# Patient Record
Sex: Female | Born: 1955
Health system: Southern US, Community
[De-identification: ages and names within clinical notes are randomized; demographics above are authoritative.]

## PROBLEM LIST (undated history)

## (undated) DIAGNOSIS — I249 Acute ischemic heart disease, unspecified: Secondary | ICD-10-CM

## (undated) DIAGNOSIS — G9389 Other specified disorders of brain: Secondary | ICD-10-CM

## (undated) DIAGNOSIS — E118 Type 2 diabetes mellitus with unspecified complications: Secondary | ICD-10-CM

## (undated) DIAGNOSIS — Z794 Long term (current) use of insulin: Secondary | ICD-10-CM

## (undated) DIAGNOSIS — I5181 Takotsubo syndrome: Secondary | ICD-10-CM

## (undated) DIAGNOSIS — R569 Unspecified convulsions: Secondary | ICD-10-CM

## (undated) DIAGNOSIS — I10 Essential (primary) hypertension: Secondary | ICD-10-CM

## (undated) DIAGNOSIS — I272 Pulmonary hypertension, unspecified: Secondary | ICD-10-CM

## (undated) DIAGNOSIS — I214 Non-ST elevation (NSTEMI) myocardial infarction: Secondary | ICD-10-CM

## (undated) HISTORY — DX: Essential (primary) hypertension: I10

## (undated) HISTORY — DX: Non-ST elevation (NSTEMI) myocardial infarction: I21.4

## (undated) HISTORY — DX: Type 2 diabetes mellitus with unspecified complications: E11.8

## (undated) HISTORY — DX: Pulmonary hypertension, unspecified: I27.20

## (undated) HISTORY — DX: Takotsubo syndrome: I51.81

## (undated) HISTORY — DX: Other specified disorders of brain: G93.89

## (undated) HISTORY — DX: Unspecified convulsions: R56.9

## (undated) HISTORY — DX: Acute ischemic heart disease, unspecified: I24.9

## (undated) HISTORY — DX: Long term (current) use of insulin: Z79.4

---

## 1995-06-03 HISTORY — PX: CHOLECYSTECTOMY: SHX55

## 2000-09-14 ENCOUNTER — Other Ambulatory Visit: Admission: RE | Admit: 2000-09-14 | Discharge: 2000-09-14 | Payer: Self-pay | Admitting: Obstetrics and Gynecology

## 2017-10-16 ENCOUNTER — Encounter (HOSPITAL_COMMUNITY): Payer: Self-pay | Admitting: *Deleted

## 2017-10-16 ENCOUNTER — Inpatient Hospital Stay (HOSPITAL_COMMUNITY)
Admission: AD | Admit: 2017-10-16 | Discharge: 2017-10-21 | DRG: 280 | Disposition: A | Discharge status: Home / Self Care | Payer: Self-pay | Source: Other Acute Inpatient Hospital | Attending: Family Medicine | Admitting: Family Medicine

## 2017-10-16 DIAGNOSIS — R4182 Altered mental status, unspecified: Secondary | ICD-10-CM

## 2017-10-16 DIAGNOSIS — I5021 Acute systolic (congestive) heart failure: Secondary | ICD-10-CM | POA: Diagnosis not present

## 2017-10-16 DIAGNOSIS — I420 Dilated cardiomyopathy: Secondary | ICD-10-CM | POA: Diagnosis present

## 2017-10-16 DIAGNOSIS — I214 Non-ST elevation (NSTEMI) myocardial infarction: Secondary | ICD-10-CM

## 2017-10-16 DIAGNOSIS — I249 Acute ischemic heart disease, unspecified: Secondary | ICD-10-CM

## 2017-10-16 DIAGNOSIS — Z9049 Acquired absence of other specified parts of digestive tract: Secondary | ICD-10-CM

## 2017-10-16 DIAGNOSIS — Z794 Long term (current) use of insulin: Secondary | ICD-10-CM

## 2017-10-16 DIAGNOSIS — I5181 Takotsubo syndrome: Secondary | ICD-10-CM | POA: Diagnosis present

## 2017-10-16 DIAGNOSIS — I493 Ventricular premature depolarization: Secondary | ICD-10-CM | POA: Diagnosis present

## 2017-10-16 DIAGNOSIS — G9389 Other specified disorders of brain: Secondary | ICD-10-CM

## 2017-10-16 DIAGNOSIS — E785 Hyperlipidemia, unspecified: Secondary | ICD-10-CM | POA: Diagnosis present

## 2017-10-16 DIAGNOSIS — E118 Type 2 diabetes mellitus with unspecified complications: Secondary | ICD-10-CM

## 2017-10-16 DIAGNOSIS — D320 Benign neoplasm of cerebral meninges: Secondary | ICD-10-CM | POA: Diagnosis present

## 2017-10-16 DIAGNOSIS — R569 Unspecified convulsions: Secondary | ICD-10-CM

## 2017-10-16 DIAGNOSIS — E119 Type 2 diabetes mellitus without complications: Secondary | ICD-10-CM | POA: Diagnosis present

## 2017-10-16 HISTORY — DX: Acute ischemic heart disease, unspecified: I24.9

## 2017-10-16 HISTORY — DX: Other specified disorders of brain: G93.89

## 2017-10-16 HISTORY — DX: Type 2 diabetes mellitus with unspecified complications: E11.8

## 2017-10-16 HISTORY — DX: Long term (current) use of insulin: Z79.4

## 2017-10-16 HISTORY — DX: Unspecified convulsions: R56.9

## 2017-10-16 MED ORDER — ASPIRIN 81 MG PO CHEW
324.0000 mg | CHEWABLE_TABLET | Freq: Once | ORAL | Status: AC
  Administered 2017-10-17: 324 mg via ORAL
  Filled 2017-10-16: qty 4

## 2017-10-16 NOTE — H&P (Signed)
History and Physical    [name] IDP:[id number] DOB: Feb 03, 1956 DOA: 10/16/2017  PCP: No primary care provider on file.  Patient coming from: [facility]   Chief Complaint: seizure-like activity   HPI: Diana [name] is a 62 y.o. female with no previously known medical history (hasn't seen a doctor in years) presenting transferred from [facility] hospital.  Was in her usual state of health today when during lunch developed shaking of right hand/arm and inability to control that hand/arm. Attempted to speak to husband but could not. This lasted approximately 10 minutes. Not too long afterward developed substernal chest pressure that resolved after about an hour. First time anything like this has happened. Did not lose consciousness, no loss of bowel or bladder. No history chest pain. Denies SOB or DOE. No known seizure history. Denies toxic habits. Doesn't take any medications. Unaware she has diabetes.  ED Course: CT head, keppra, neurosurg and cardiology consults, EKG  Review of Systems: As per HPI otherwise 10 point review of systems negative.    History reviewed. No pertinent past medical history.  Past Surgical History:  Procedure Laterality Date  . CHOLECYSTECTOMY  1997     reports that she has never smoked. She has never used smokeless tobacco. She reports that she does not drink alcohol or use drugs.  No Known Allergies  History reviewed. No pertinent family history.  Prior to Admission medications   Not on File    Physical Exam: Vitals:   10/16/17 2240  BP: 98/85  Pulse: (!) 123  Temp: 99.5 F (37.5 C)  TempSrc: Oral  SpO2: 98%  Weight: 70.5 kg (155 lb 6.8 oz)  Height: 5\' 3"  (1.6 m)    Constitutional: No acute distress Head: Atraumatic Eyes: Conjunctiva clear ENM: Moist mucous membranes. Normal dentition.  Neck: Supple Respiratory: Clear to auscultation bilaterally, no wheezing/rales/rhonchi. Normal respiratory effort. No accessory muscle use.  . Cardiovascular: tachycardic; regular rhythm. No murmurs/rubs/gallops. Abdomen: Non-tender, non-distended. No masses. No rebound or guarding. Positive bowel sounds. Musculoskeletal: No joint deformity upper and lower extremities. Normal ROM, no contractures. Normal muscle tone.  Skin: No rashes, lesions, or ulcers.  Extremities: No peripheral edema. Palpable peripheral pulses. Neurologic: Alert, moving all 4 extremities. Psychiatric: Normal insight and judgement.   Labs on Admission: I have personally reviewed following labs and imaging studies  CBC: No results for input(s): WBC, NEUTROABS, HGB, HCT, MCV, PLT in the last 168 hours. Basic Metabolic Panel: No results for input(s): NA, K, CL, CO2, GLUCOSE, BUN, CREATININE, CALCIUM, MG, PHOS in the last 168 hours. GFR: CrCl cannot be calculated (No order found.). Liver Function Tests: No results for input(s): AST, ALT, ALKPHOS, BILITOT, PROT, ALBUMIN in the last 168 hours. No results for input(s): LIPASE, AMYLASE in the last 168 hours. No results for input(s): AMMONIA in the last 168 hours. Coagulation Profile: No results for input(s): INR, PROTIME in the last 168 hours. Cardiac Enzymes: No results for input(s): CKTOTAL, CKMB, CKMBINDEX, TROPONINI in the last 168 hours. BNP (last 3 results) No results for input(s): PROBNP in the last 8760 hours. HbA1C: No results for input(s): HGBA1C in the last 72 hours. CBG: No results for input(s): GLUCAP in the last 168 hours. Lipid Profile: No results for input(s): CHOL, HDL, LDLCALC, TRIG, CHOLHDL, LDLDIRECT in the last 72 hours. Thyroid Function Tests: No results for input(s): TSH, T4TOTAL, FREET4, T3FREE, THYROIDAB in the last 72 hours. Anemia Panel: No results for input(s): VITAMINB12, FOLATE, FERRITIN, TIBC, IRON, RETICCTPCT in the last 72 hours.  Urine analysis: No results found for: COLORURINE, APPEARANCEUR, LABSPEC, PHURINE, GLUCOSEU, HGBUR, BILIRUBINUR, KETONESUR, PROTEINUR,  UROBILINOGEN, NITRITE, LEUKOCYTESUR  Radiological Exams on Admission: No results found.  EKG: pending  Assessment/Plan Active Problems:   Type 2 diabetes mellitus (HCC)   ACS (acute coronary syndrome) (HCC)   Brain mass   Seizure-like activity (HCC)   # Acute coronary syndrome - Chest pain and possible atypical acs symptoms earlier today, in setting of uncontrolled diabetes. Per report, dynamic EKG changes at [facility], troponin normal initially then elevated to 3.98. Initial concern for NSTEMI. Discussed W. Dr. Raiford Simmonds local cardologist discussed w/ Dr. Gillian Shields at [facility], plan for consult when pt arrives. Per report, cardiology advised no asa or anticoagulation for now (see below). I have spoken w/ our on-call cardiologist Dr. Emilio Aspen who advises asa 325 oral now, f/u ekg and initial troponin, likely start heparin acs nomogram pending neurosurg eval. I have spoken w/ neurosurg consult as well, who advises stat mri mrain; if consistent w/ meningioma and no signs bleeding, OK to start anticoagulation. Reassuringly, pt is hemodynamically stable and chest pain free currently. - aspirin - f/u troponin and trend - f/u EKG - f/u MRI, likely start acs nomogram heparin after - telemetry - npo - NS @ 125  # Brain mass  # Seizure-like activity- CT suggestive of meningioma. Today possible seizure. Keppra loaded at [facility]. No seizure-like activity since this afternoon. - continue keppra 500 mg po bid - neurology consult in AM - f/u MRI  # T2DM - new diagnosis, per report a1c at [place] [facility]. - SSI, likely start long-acting - risk stratification labs  DVT prophylaxis: SCDS, holding pharm pending MRI Code Status: full  Family Communication: husband Diana [name] [phone]  Disposition Plan: tbd  Consults called: cardiology, neurosurgery  Admission status: tele    Desma Maxim MD [facility] Pager [phone]  If 7PM-7AM, please contact  night-coverage [email or web address] Password [facility]  10/16/2017, 11:49 PM

## 2017-10-16 NOTE — Care Management (Signed)
This is a no charge note  Pending admission per Dr.   Nicki Guadalajara from [place] per Dr.  Levell July  62 year old female without significant past medical history, who was admitted to [facility] due to possible seizure and newly diagnosed DM. Her CT head was significant for 1.6 cm sagittal mass, but no brain edema or mass-effect.  Per ED discussion with Neurosurgery at [facility], this is most likely meningioma. Pt has abnormal EKG and elevated troponin 3.98. She was seen by cardiologist who discussed with STEMI Code Team at [facility]. They think this is possibly due to vasospasm. Dr. Raiford Simmonds local cardiologist discussed with Dr Gillian Shields at [facility], they are agreeable on consultation when patient arrives at [facility] regarding further recommendation.  They did not start IV heparin due to possible seizure and brain mass. Will need to discuss with [facility] neurosurgeon regarding starting IV heparin.  Currently patient is hemodynamically stable.   Dr. Levell July will cope his note and send [place] by Email. Will need MRI of brain with and without contrast; will need to consult neurosurgeon; will need to inform cardiology at pt's arrival.   Please call manager of [facility] at [phone] when pt arrives to floor   Ivor Costa, MD  [facility] Pager [phone]  If 7PM-7AM, please contact night-coverage [email or web address] Password [facility] 10/16/2017, 8:47 PM

## 2017-10-17 ENCOUNTER — Inpatient Hospital Stay (HOSPITAL_COMMUNITY): Payer: Self-pay

## 2017-10-17 ENCOUNTER — Other Ambulatory Visit: Payer: Self-pay

## 2017-10-17 DIAGNOSIS — I361 Nonrheumatic tricuspid (valve) insufficiency: Secondary | ICD-10-CM

## 2017-10-17 LAB — CBC WITH DIFFERENTIAL/PLATELET
Abs Immature Granulocytes: 0.1 10*3/uL (ref 0.0–0.1)
BASOS ABS: 0.1 10*3/uL (ref 0.0–0.1)
BASOS PCT: 1 %
EOS ABS: 0 10*3/uL (ref 0.0–0.7)
Eosinophils Relative: 0 %
HCT: 42.5 % (ref 36.0–46.0)
Hemoglobin: 14.8 g/dL (ref 12.0–15.0)
Immature Granulocytes: 0 %
Lymphocytes Relative: 16 %
Lymphs Abs: 2.4 10*3/uL (ref 0.7–4.0)
MCH: 29.7 pg (ref 26.0–34.0)
MCHC: 34.8 g/dL (ref 30.0–36.0)
MCV: 85.2 fL (ref 78.0–100.0)
Monocytes Absolute: 1.1 10*3/uL — ABNORMAL HIGH (ref 0.1–1.0)
Monocytes Relative: 7 %
Neutro Abs: 11.6 10*3/uL — ABNORMAL HIGH (ref 1.7–7.7)
Neutrophils Relative %: 76 %
PLATELETS: 352 10*3/uL (ref 150–400)
RBC: 4.99 MIL/uL (ref 3.87–5.11)
RDW: 12.2 % (ref 11.5–15.5)
WBC: 15.6 10*3/uL — AB (ref 4.0–10.5)

## 2017-10-17 LAB — LIPID PANEL
Cholesterol: 191 mg/dL (ref 0–200)
HDL: 32 mg/dL — AB (ref >40)
LDL CALC: 120 mg/dL — AB (ref 0–99)
TRIGLYCERIDES: 196 mg/dL — AB (ref <150)
Total CHOL/HDL Ratio: 6 RATIO
VLDL: 39 mg/dL (ref 0–40)

## 2017-10-17 LAB — BASIC METABOLIC PANEL
Anion gap: 15 (ref 5–15)
BUN: 15 mg/dL (ref 6–20)
CALCIUM: 9 mg/dL (ref 8.9–10.3)
CO2: 19 mmol/L — ABNORMAL LOW (ref 22–32)
CREATININE: 0.86 mg/dL (ref 0.44–1.00)
Chloride: 104 mmol/L (ref 101–111)
GFR calc Af Amer: >60 mL/min (ref >60)
GFR calc non Af Amer: >60 mL/min (ref >60)
Glucose, Bld: 346 mg/dL — ABNORMAL HIGH (ref 65–99)
POTASSIUM: 3.7 mmol/L (ref 3.5–5.1)
SODIUM: 138 mmol/L (ref 135–145)

## 2017-10-17 LAB — ECHOCARDIOGRAM COMPLETE
Height: 63 in
WEIGHTICAEL: 2486.79 [oz_av]

## 2017-10-17 LAB — HEPARIN LEVEL (UNFRACTIONATED)
Heparin Unfractionated: 0.26 IU/mL — ABNORMAL LOW (ref 0.30–0.70)
Heparin Unfractionated: 0.38 IU/mL (ref 0.30–0.70)

## 2017-10-17 LAB — COMPREHENSIVE METABOLIC PANEL
ALBUMIN: 3.7 g/dL (ref 3.5–5.0)
ALT: 16 U/L (ref 14–54)
AST: 30 U/L (ref 15–41)
Alkaline Phosphatase: 81 U/L (ref 38–126)
Anion gap: 11 (ref 5–15)
BILIRUBIN TOTAL: 0.9 mg/dL (ref 0.3–1.2)
BUN: 17 mg/dL (ref 6–20)
CALCIUM: 9.5 mg/dL (ref 8.9–10.3)
CHLORIDE: 101 mmol/L (ref 101–111)
CO2: 22 mmol/L (ref 22–32)
CREATININE: 1.41 mg/dL — AB (ref 0.44–1.00)
GFR calc Af Amer: 46 mL/min — ABNORMAL LOW (ref >60)
GFR calc non Af Amer: 39 mL/min — ABNORMAL LOW (ref >60)
GLUCOSE: 491 mg/dL — AB (ref 65–99)
Potassium: 4.4 mmol/L (ref 3.5–5.1)
SODIUM: 134 mmol/L — AB (ref 135–145)
Total Protein: 6.7 g/dL (ref 6.5–8.1)

## 2017-10-17 LAB — TYPE AND SCREEN
ABO/RH(D): O POS
Antibody Screen: NEGATIVE

## 2017-10-17 LAB — TSH: TSH: 5.1 u[IU]/mL — ABNORMAL HIGH (ref 0.350–4.500)

## 2017-10-17 LAB — APTT: aPTT: 72 seconds — ABNORMAL HIGH (ref 24–36)

## 2017-10-17 LAB — HEMOGLOBIN A1C
Hgb A1c MFr Bld: 11.2 % — ABNORMAL HIGH (ref 4.8–5.6)
Mean Plasma Glucose: 274.74 mg/dL

## 2017-10-17 LAB — TROPONIN I
TROPONIN I: 4.21 ng/mL — AB (ref <0.03)
Troponin I: 4.26 ng/mL (ref <0.03)
Troponin I: 4.52 ng/mL (ref <0.03)

## 2017-10-17 LAB — GLUCOSE, CAPILLARY
GLUCOSE-CAPILLARY: 293 mg/dL — AB (ref 65–99)
GLUCOSE-CAPILLARY: 295 mg/dL — AB (ref 65–99)
Glucose-Capillary: 453 mg/dL — ABNORMAL HIGH (ref 65–99)
Glucose-Capillary: 311 mg/dL — ABNORMAL HIGH (ref 65–99)
Glucose-Capillary: 276 mg/dL — ABNORMAL HIGH (ref 65–99)

## 2017-10-17 LAB — ABO/RH: ABO/RH(D): O POS

## 2017-10-17 LAB — PROTIME-INR
INR: 1.17
PROTHROMBIN TIME: 14.8 s (ref 11.4–15.2)

## 2017-10-17 MED ORDER — ATORVASTATIN CALCIUM 80 MG PO TABS
80.0000 mg | ORAL_TABLET | Freq: Every day | ORAL | Status: DC
  Administered 2017-10-17 – 2017-10-19 (×3): 80 mg via ORAL
  Filled 2017-10-17 (×3): qty 1

## 2017-10-17 MED ORDER — INSULIN ASPART 100 UNIT/ML ~~LOC~~ SOLN
0.0000 [IU] | Freq: Every day | SUBCUTANEOUS | Status: DC
  Administered 2017-10-17 – 2017-10-18 (×2): 3 [IU] via SUBCUTANEOUS
  Administered 2017-10-20: 4 [IU] via SUBCUTANEOUS

## 2017-10-17 MED ORDER — CARVEDILOL 12.5 MG PO TABS: 12.5000 mg | ORAL_TABLET | Freq: Two times a day (BID) | ORAL | Status: DC

## 2017-10-17 MED ORDER — INSULIN ASPART 100 UNIT/ML ~~LOC~~ SOLN
8.0000 [IU] | Freq: Once | SUBCUTANEOUS | Status: AC
  Administered 2017-10-17: 8 [IU] via SUBCUTANEOUS

## 2017-10-17 MED ORDER — GADOBENATE DIMEGLUMINE 529 MG/ML IV SOLN
7.0000 mL | Freq: Once | INTRAVENOUS | Status: AC | PRN
  Administered 2017-10-17: 7 mL via INTRAVENOUS

## 2017-10-17 MED ORDER — HEPARIN (PORCINE) IN NACL 100-0.45 UNIT/ML-% IJ SOLN
1200.0000 [IU]/h | INTRAMUSCULAR | Status: DC
  Administered 2017-10-17: 900 [IU]/h via INTRAVENOUS
  Administered 2017-10-18: 1050 [IU]/h via INTRAVENOUS
  Filled 2017-10-17 (×3): qty 250

## 2017-10-17 MED ORDER — METOPROLOL TARTRATE 12.5 MG HALF TABLET
12.5000 mg | ORAL_TABLET | Freq: Two times a day (BID) | ORAL | Status: DC
  Administered 2017-10-17 – 2017-10-20 (×6): 12.5 mg via ORAL
  Filled 2017-10-17 (×7): qty 1

## 2017-10-17 MED ORDER — HEPARIN BOLUS VIA INFUSION
2000.0000 [IU] | Freq: Once | INTRAVENOUS | Status: AC
  Administered 2017-10-17: 2000 [IU] via INTRAVENOUS
  Filled 2017-10-17: qty 2000

## 2017-10-17 MED ORDER — LEVETIRACETAM IN NACL 500 MG/100ML IV SOLN
500.0000 mg | Freq: Two times a day (BID) | INTRAVENOUS | Status: DC
  Administered 2017-10-17 – 2017-10-20 (×7): 500 mg via INTRAVENOUS
  Filled 2017-10-17 (×7): qty 100

## 2017-10-17 MED ORDER — ASPIRIN EC 81 MG PO TBEC
81.0000 mg | DELAYED_RELEASE_TABLET | Freq: Every day | ORAL | Status: DC
  Administered 2017-10-17 – 2017-10-21 (×4): 81 mg via ORAL
  Filled 2017-10-17 (×4): qty 1

## 2017-10-17 MED ORDER — PERFLUTREN LIPID MICROSPHERE
INTRAVENOUS | Status: AC
  Administered 2017-10-17: 3 mL via INTRAVENOUS
  Filled 2017-10-17: qty 10

## 2017-10-17 MED ORDER — INSULIN ASPART 100 UNIT/ML ~~LOC~~ SOLN
0.0000 [IU] | Freq: Three times a day (TID) | SUBCUTANEOUS | Status: DC
  Administered 2017-10-17: 11 [IU] via SUBCUTANEOUS
  Administered 2017-10-17 (×2): 8 [IU] via SUBCUTANEOUS
  Administered 2017-10-18: 5 [IU] via SUBCUTANEOUS
  Administered 2017-10-18 (×2): 8 [IU] via SUBCUTANEOUS
  Administered 2017-10-19: 5 [IU] via SUBCUTANEOUS
  Administered 2017-10-19: 8 [IU] via SUBCUTANEOUS
  Administered 2017-10-20 (×3): 5 [IU] via SUBCUTANEOUS
  Administered 2017-10-21 (×2): 3 [IU] via SUBCUTANEOUS
  Administered 2017-10-21: 5 [IU] via SUBCUTANEOUS

## 2017-10-17 MED ORDER — PERFLUTREN LIPID MICROSPHERE
1.0000 mL | INTRAVENOUS | Status: AC | PRN
  Administered 2017-10-17: 3 mL via INTRAVENOUS
  Filled 2017-10-17: qty 10

## 2017-10-17 MED ORDER — SODIUM CHLORIDE 0.9 % IV BOLUS
1000.0000 mL | Freq: Once | INTRAVENOUS | Status: AC
  Administered 2017-10-17: 1000 mL via INTRAVENOUS

## 2017-10-17 MED ORDER — SODIUM CHLORIDE 0.9 % IV SOLN
INTRAVENOUS | Status: DC
  Administered 2017-10-17 (×2): via INTRAVENOUS

## 2017-10-17 NOTE — Progress Notes (Signed)
  Echocardiogram 2D Echocardiogram with definity has been performed.  Diana [name] M 10/17/2017, 2:53 PM

## 2017-10-17 NOTE — Progress Notes (Signed)
Patient ID: Diana [name], female   DOB: 30-Nov-1955, 62 y.o.   MRN: [id number]                                                                PROGRESS NOTE                                                                                                                                                                                                             Patient Demographics:    Diana [name], is a 62 y.o. female, DOB - Jun 30, 1955, EQA:[id number]  Admit date - 10/16/2017   Admitting Physician Ivor Costa, MD  Outpatient Primary MD for the patient is No primary care provider on file.  LOS - 1  Outpatient Specialists:  No chief complaint on file.      Brief Narrative a 62 y.o. female with no previously known medical history (hasn't seen a doctor in years) presenting transferred from [facility].  Was in her usual state of health today when during lunch developed shaking of right hand/arm and inability to control that hand/arm. Attempted to speak to husband but could not. This lasted approximately 10 minutes. Not too long afterward developed substernal chest pressure that resolved after about an hour. First time anything like this has happened. Did not lose consciousness, no loss of bowel or bladder. No history chest pain. Denies SOB or DOE. No known seizure history. Denies toxic habits. Doesn't take any medications. Unaware she has diabetes.  ED Course: CT head, keppra, neurosurg and cardiology consults, EKG      Subjective:    Tykesha Blazier today has no further chest pain.  Chest pain yesterday lasted for about 1 hour.  Left sided without radiation. Denies fever, chills, cough, sob, palp, n/v, diarrhea, brbpr.  Pt had seizure type activity with uncontrolled shaking of the right arm but has not had any further episode overnite.    No headache, No abdominal pain - No Nausea, No new weakness tingling or numbness.   Assessment  & Plan :    Active Problems:   Type 2 diabetes mellitus (HCC)  ACS (acute coronary syndrome) (HCC)   Brain mass   Seizure-like activity (HCC)    Acute coronary syndrome -  Chest pain and possible atypical acs symptoms earlier today, in setting of  uncontrolled diabetes. Per report, dynamic EKG changes at [place] hospital, troponin normal initially then elevated to 3.98. Initial concern for NSTEMI.  Cardiology consulted Aspirin received at [facility] ? Cont heparin iv Start Lipitor 80mg  po qhs Start metoprolol 12.5mg  po bid  (hold sbp <100, hr <60) Check lipid   Brain mass, Seizure-like activity-  MRI brain 5/18=> ? Malignancy Keppra loaded at [facility]. No further seizure like activity Continue keppra 500 mg iv bid Neurosurgery consult   T2DM - new diagnosis, per report a1c at [place] [facility]. Fsbs q4h, ISS   DVT prophylaxis: heparin, SCDS,  Code Status: full  Family Communication: w patient this am.  husband ken Hirata [phone]  Disposition Plan: tbd  Consults called: cardiology, neurosurgery  Admission status: tele , inpatient       Lab Results  Component Value Date   PLT 352 10/16/2017    Antibiotics  :  none  Anti-infectives (From admission, onward)   None        Objective:   Vitals:   10/16/17 2240  BP: 98/85  Pulse: (!) 123  Resp: 18  Temp: 99.5 F (37.5 C)  TempSrc: Oral  SpO2: 98%  Weight: 70.5 kg (155 lb 6.8 oz)  Height: 5\' 3"  (1.6 m)    Wt Readings from Last 3 Encounters:  10/16/17 70.5 kg (155 lb 6.8 oz)     Intake/Output Summary (Last 24 hours) at 10/17/2017 0736 Last data filed at 10/17/2017 0701 Gross per 24 hour  Intake 1311.02 ml  Output -  Net 1311.02 ml     Physical Exam  Awake Alert, Oriented X 3, No new F.N deficits, Normal affect [place].AT,PERRAL Supple Neck,No JVD, No cervical lymphadenopathy appriciated.  Symmetrical Chest wall movement, Good air movement bilaterally, CTAB RRR,No Gallops,Rubs or new Murmurs, No Parasternal Heave +ve B.Sounds, Abd Soft, No tenderness, No  organomegaly appriciated, No rebound - guarding or rigidity. No Cyanosis, Clubbing or edema, No new Rash or bruise     Data Review:    CBC Recent Labs  Lab 10/16/17 2336  WBC 15.6*  HGB 14.8  HCT 42.5  PLT 352  MCV 85.2  MCH 29.7  MCHC 34.8  RDW 12.2  LYMPHSABS 2.4  MONOABS 1.1*  EOSABS 0.0  BASOSABS 0.1    Chemistries  Recent Labs  Lab 10/16/17 2336  NA 134*  K 4.4  CL 101  CO2 22  GLUCOSE 491*  BUN 17  CREATININE 1.41*  CALCIUM 9.5  AST 30  ALT 16  ALKPHOS 81  BILITOT 0.9   ------------------------------------------------------------------------------------------------------------------ No results for input(s): CHOL, HDL, LDLCALC, TRIG, CHOLHDL, LDLDIRECT in the last 72 hours.  Lab Results  Component Value Date   HGBA1C 11.2 (H) 10/16/2017   ------------------------------------------------------------------------------------------------------------------ No results for input(s): TSH, T4TOTAL, T3FREE, THYROIDAB in the last 72 hours.  Invalid input(s): FREET3 ------------------------------------------------------------------------------------------------------------------ No results for input(s): VITAMINB12, FOLATE, FERRITIN, TIBC, IRON, RETICCTPCT in the last 72 hours.  Coagulation profile No results for input(s): INR, PROTIME in the last 168 hours.  No results for input(s): DDIMER in the last 72 hours.  Cardiac Enzymes Recent Labs  Lab 10/16/17 2336  TROPONINI 4.52*   ------------------------------------------------------------------------------------------------------------------ No results found for: BNP  Inpatient Medications  Scheduled Meds: . insulin aspart  0-15 Units Subcutaneous TID WC  . insulin aspart  0-5 Units Subcutaneous QHS   Continuous Infusions: . sodium chloride 125 mL/hr at 10/17/17 0444  . heparin 900 Units/hr (10/17/17 0617)  . levETIRAcetam     PRN Meds:.  Micro Results No results found for this or any  previous visit (from the past 240 hour(s)).  Radiology Reports Mr Jeri Cos Wo Contrast  Result Date: 10/17/2017 CLINICAL DATA:  Follow-up examination for intracranial mass. EXAM: MRI HEAD WITHOUT AND WITH CONTRAST TECHNIQUE: Multiplanar, multiecho pulse sequences of the brain and surrounding structures were obtained without and with intravenous contrast. CONTRAST:  45mL MULTIHANCE GADOBENATE DIMEGLUMINE 529 MG/ML IV SOLN COMPARISON:  Prior CT from 10/16/2017. FINDINGS: Brain: Abnormal T2/FLAIR signal abnormality seen involving the parasagittal left frontal lobe, cingulate gyrus (series 11001, image 18). Area of involvement measures approximately 4.6 x 1.3 cm in size. There is a superimposed area of abnormal postcontrast enhancement within this region, measuring 1.5 x 1.2 x 1.3 cm (series 17001, image 45). This area postcontrast enhancement corresponds with abnormality seen on prior CT. Signal changes are intra-axial in location. No associated susceptibility artifact or intrinsic T1 shortening to suggest hemorrhage. 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 Superimposed postictal changes may be contributory as well. While subacute ischemia could conceivably have this appearance, this is less favored given the enhancement pattern in appearance on prior CT. No other focal parenchymal signal abnormality identified. Cerebral volume is normal for age. No significant cerebral white matter changes. No other evidence for acute or subacute ischemia. Gray-white matter differentiation otherwise well maintained. No encephalomalacia to suggest chronic infarction. No foci of susceptibility artifact to suggest acute or chronic intracranial hemorrhage. No other mass lesion, midline shift, or mass effect. No hydrocephalus. No extra-axial fluid collection. Major dural sinuses are grossly patent. Pituitary gland and suprasellar region normal. Midline structures intact and normal. Vascular: Major intracranial vascular  flow voids are maintained. Skull and upper cervical spine: Craniocervical junction within normal limits. Visualized upper cervical spine normal. Bone marrow signal intensity within normal limits. No scalp soft tissue abnormality. Sinuses/Orbits: Globes orbital soft tissues within normal limits. Paranasal sinuses are largely clear. No mastoid effusion. Inner ear structures normal. Other: None. IMPRESSION: 1. Abnormal signal intensity with superimposed 1.5 x 1.2 x 1.3 cm area of abnormal enhancement involving the left cingulate parasagittal left frontal lobe as above. 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 Superimposed postictal changes may be contributory as well. While subacute ischemia could conceivably have this appearance, this is less favored given the enhancement pattern in appearance on prior CT. No associated hemorrhage or mass effect. 2. Otherwise normal brain MRI. Electronically Signed   By: Jeannine Boga M.D.   On: 10/17/2017 05:26    Time Spent in minutes  30   Jani Gravel M.D on 10/17/2017 at 7:36 AM  Between 7am to 7pm - Pager - [phone]    After 7pm go to [email or web address] - password [facility]  [facility] -  Office  [phone]

## 2017-10-17 NOTE — Consult Note (Addendum)
CARDIOLOGY CONSULT NOTE   Referring Physician: Dr. Si Raider Primary Physician: None Primary Cardiologist: None Reason for Consultation: Troponin elevation   HPI: Diana [name] is a 63 y.o. female w/ no medical history who presents with seizure like activity, found to have an intracranial mass. Cardiology is consulted for elevated troponin.   In brief, the patient does not routinely see doctors and does not have any known medical problems. She was out to eat with her husband when suddenly she could not properly use her R and and arm. She tried to tell her husband about this but was unable to speak. She had never had any symptoms like this before. She was brought to [facility] for workup. A CT head was performed and revealed a 1.6 cm intracranial mass, likely meningioma.   While in the ED she reported feeling very mild chest pressure. A troponin was checked and was initially zero, but increased to ~3 on a subsequent check. She was not given any treatment for ACS. Her chest symptoms had completely resolved. She was transferred to [facility] for further treatment.   Of note, the patient does not smoke cigarettes, does not drink alcohol, and does not use drugs. She does not perform any regular physical activity. She does become short of breath when walking up a flight of stairs. She has no symptoms of exertional chest pain or pressure.   Review of Systems:     Cardiac Review of Systems: {Y] = yes [ ]  = no  Chest Pain [  Y  ]  Resting SOB [   ] Exertional SOB  [  ]  Orthopnea [  ]   Pedal Edema [   ]    Palpitations [  ] Syncope  [  ]   Presyncope [   ]  General Review of Systems: [Y] = yes [  ]=no Constitional: recent weight change [  ]; anorexia [  ]; fatigue [  ]; nausea [  ]; night sweats [  ]; fever [  ]; or chills [  ];                                                                     Eyes : blurred vision [  ]; diplopia [   ]; vision changes [  ];  Amaurosis fugax[  ]; Resp: cough [  ];   wheezing[  ];  hemoptysis[  ];  PND [  ];  GI:  gallstones[  ], vomiting[  ];  dysphagia[  ]; melena[  ];  hematochezia [  ]; heartburn[  ];   GU: kidney stones [  ]; hematuria[  ];   dysuria [  ];  nocturia[  ]; incontinence [  ];             Skin: rash, swelling[  ];, hair loss[  ];  peripheral edema[  ];  or itching[  ]; Musculosketetal: myalgias[  ];  joint swelling[  ];  joint erythema[  ];  joint pain[  ];  back pain[  ];  Heme/Lymph: bruising[  ];  bleeding[  ];  anemia[  ];  Neuro: TIA[  ];  headaches[  ];  stroke[  ];  vertigo[  ];  seizures[  ];  paresthesias[  ];  difficulty walking[  ];  Psych:depression[  ]; anxiety[  ];  Endocrine: diabetes[  ];  thyroid dysfunction[  ];  Other:  History reviewed. No pertinent past medical history.  No medications prior to admission.    Infusions: . sodium chloride      No Known Allergies  Social History   Socioeconomic History  . Marital status: Unknown    Spouse name: Not on file  . Number of children: Not on file  . Years of education: Not on file  . Highest education level: Not on file  Occupational History  . Not on file  Social Needs  . Financial resource strain: Not on file  . Food insecurity:    Worry: Not on file    Inability: Not on file  . Transportation needs:    Medical: Not on file    Non-medical: Not on file  Tobacco Use  . Smoking status: Never Smoker  . Smokeless tobacco: Never Used  Substance and Sexual Activity  . Alcohol use: Never    Frequency: Never  . Drug use: Never  . Sexual activity: Yes  Lifestyle  . Physical activity:    Days per week: Not on file    Minutes per session: Not on file  . Stress: Not on file  Relationships  . Social connections:    Talks on phone: Not on file    Gets together: Not on file    Attends religious service: Not on file    Active member of club or organization: Not on file    Attends meetings of clubs or organizations: Not on file    Relationship status:  Not on file  . Intimate partner violence:    Fear of current or ex partner: Not on file    Emotionally abused: Not on file    Physically abused: Not on file    Forced sexual activity: Not on file  Other Topics Concern  . Not on file  Social History Narrative  . Not on file    History reviewed. No pertinent family history.  PHYSICAL EXAM: Vitals:   10/16/17 2240  BP: 98/85  Pulse: (!) 123  Resp: 18  Temp: 99.5 F (37.5 C)  SpO2: 98%    No intake or output data in the 24 hours ending 10/17/17 0057  General:  Well appearing. No respiratory difficulty HEENT: normal Neck: supple. no JVD. Carotids 2+ bilat; no bruits. No lymphadenopathy or thryomegaly appreciated. Cor: PMI nondisplaced. Tachycardic. +S4. No murmurs. Lungs: clear Abdomen: soft, nontender, nondistended. No hepatosplenomegaly. No bruits or masses. Good bowel sounds. Extremities: no cyanosis, clubbing, rash, edema Neuro: alert & oriented x 3, cranial nerves grossly intact. moves all 4 extremities w/o difficulty. Affect pleasant.  ECG:  Sinus tachycardia, HR 120, left axis deviation, occasional PVCs, inferior/septal/anterior Q waves, 2 cm ST elevation in V2 (likely J point elevation); no prior ECG for comparison  Results for orders placed or performed during the hospital encounter of 10/16/17 (from the past 24 hour(s))  CBC with Differential/Platelet     Status: Abnormal   Collection Time: 10/16/17 11:36 PM  Result Value Ref Range   WBC 15.6 (H) 4.0 - 10.5 K/uL   RBC 4.99 3.87 - 5.11 MIL/uL   Hemoglobin 14.8 12.0 - 15.0 g/dL   HCT 42.5 36.0 - 46.0 %   MCV 85.2 78.0 - 100.0 fL   MCH 29.7 26.0 - 34.0 pg   MCHC 34.8 30.0 - 36.0 g/dL  RDW 12.2 11.5 - 15.5 %   Platelets 352 150 - 400 K/uL   Neutrophils Relative % 76 %   Neutro Abs 11.6 (H) 1.7 - 7.7 K/uL   Lymphocytes Relative 16 %   Lymphs Abs 2.4 0.7 - 4.0 K/uL   Monocytes Relative 7 %   Monocytes Absolute 1.1 (H) 0.1 - 1.0 K/uL   Eosinophils Relative 0 %     Eosinophils Absolute 0.0 0.0 - 0.7 K/uL   Basophils Relative 1 %   Basophils Absolute 0.1 0.0 - 0.1 K/uL   Immature Granulocytes 0 %   Abs Immature Granulocytes 0.1 0.0 - 0.1 K/uL  Comprehensive metabolic panel     Status: Abnormal   Collection Time: 10/16/17 11:36 PM  Result Value Ref Range   Sodium 134 (L) 135 - 145 mmol/L   Potassium 4.4 3.5 - 5.1 mmol/L   Chloride 101 101 - 111 mmol/L   CO2 22 22 - 32 mmol/L   Glucose, Bld 491 (H) 65 - 99 mg/dL   BUN 17 6 - 20 mg/dL   Creatinine, Ser 1.41 (H) 0.44 - 1.00 mg/dL   Calcium 9.5 8.9 - 10.3 mg/dL   Total Protein 6.7 6.5 - 8.1 g/dL   Albumin 3.7 3.5 - 5.0 g/dL   AST 30 15 - 41 U/L   ALT 16 14 - 54 U/L   Alkaline Phosphatase 81 38 - 126 U/L   Total Bilirubin 0.9 0.3 - 1.2 mg/dL   GFR calc non Af Amer 39 (L) >60 mL/min   GFR calc Af Amer 46 (L) >60 mL/min   Anion gap 11 5 - 15  Hemoglobin A1c     Status: Abnormal   Collection Time: 10/16/17 11:36 PM  Result Value Ref Range   Hgb A1c MFr Bld 11.2 (H) 4.8 - 5.6 %   Mean Plasma Glucose 274.74 mg/dL  Type and screen [place]     Status: None   Collection Time: 10/16/17 11:36 PM  Result Value Ref Range   ABO/RH(D) O POS    Antibody Screen NEG    Sample Expiration      10/19/2017 Performed at [facility], [address] [place], [place] [address]   Troponin I (q 6hr x 3)     Status: Abnormal   Collection Time: 10/16/17 11:36 PM  Result Value Ref Range   Troponin I 4.52 (HH) <0.03 ng/mL  ABO/Rh     Status: None (Preliminary result)   Collection Time: 10/16/17 11:36 PM  Result Value Ref Range   ABO/RH(D)      O POS Performed at [facility] [address], [place], [place] [address]    No results found.   ASSESSMENT: In summary, this is a patient with no prior medical history who presents with abnormal R arm activity, found to have an intracranial mass, new diabetes, and a NSTEMI. It is difficult to connect her R arm and speech symptoms  with her NSTEMI. Nonetheless, she had chest pain, ECG abnormalities, and a positive troponin and thus should be treated as a type 1 acute coronary syndrome. She is currently being evaluated by neurosurgery as to whether she is able to receive heparin for anticoagulation and whether there is any indication for surgery. Any future cardiac intervention will be contingent on the neurosurgical decision-making.   It is worth noting that there are several characteristics of the patient's case that are of concern. Her resting tachycardia to the 120's, the  multiple territories of infarct on ECG, her frequent ventricular ectopy, and her rapidly rising troponin are examples of such. She warrants close observation over the next 24 to 48 hours while additional data are collected.   PLAN/DISCUSSION: - give aspirin 324mg  once then 81mg  daily - start atorvastatin 80mg  daily - order echocardiogram - repeat troponin x 2 - give 1L normal saline bolus due to tachycardia - start insulin therapy for treatment of diabetes - hold off on starting beta blocker for now until systolic function is evaluated by echo - start heparin drip with PTT goal 50-70 as soon as ok'd by neurosurgery  Cardiology will continue to follow  Marcie Mowers, MD Cardiology Fellow, PGY-5

## 2017-10-17 NOTE — Progress Notes (Signed)
Nutrition Brief Note  Patient identified on the Malnutrition Screening Tool (MST) Report. She reports stable weight for the past 1.5 years; prior to that she had lost a lot of weight.  Nutrition focused physical exam completed.  No muscle or subcutaneous fat depletion noticed.   Wt Readings from Last 15 Encounters:  10/16/17 155 lb 6.8 oz (70.5 kg)    Body mass index is 27.53 kg/m. Patient meets criteria for overweight based on current BMI.   Current diet order is NPO for possible procedure, patient is hungry. Labs and medications reviewed. Expect intake will be adequate when diet is advanced.  No nutrition interventions warranted at this time. If nutrition issues arise, please consult RD.   Molli Barrows, RD, LDN, [place] Pager [phone] After Hours Pager [phone]

## 2017-10-17 NOTE — Progress Notes (Signed)
ANTICOAGULATION CONSULT NOTE - Follow Up Consult  Pharmacy Consult for Heparin Indication: chest pain/ACS  No Known Allergies  Patient Measurements: Height: 5\' 3"  (160 cm) Weight: 155 lb 6.8 oz (70.5 kg) IBW/kg (Calculated) : 52.4 Heparin Dosing Weight: 67 kg  Vital Signs: Temp: 98.9 F (37.2 C) (05/18 2009) Temp Source: Oral (05/18 2009) BP: 94/70 (05/18 2009) Pulse Rate: 98 (05/18 2009)  Labs: Recent Labs    10/16/17 2336 10/17/17 0640 10/17/17 1210 10/17/17 1934  HGB 14.8  --   --   --   HCT 42.5  --   --   --   PLT 352  --   --   --   APTT  --  72*  --   --   LABPROT  --  14.8  --   --   INR  --  1.17  --   --   HEPARINUNFRC  --   --  0.26* 0.38  CREATININE 1.41* 0.86  --   --   TROPONINI 4.52* 4.21* 4.26*  --     Estimated Creatinine Clearance: 64.6 mL/min (by C-G formula based on SCr of 0.86 mg/dL).   Medications:  Infusions:  . sodium chloride 125 mL/hr at 10/17/17 1405  . heparin 1,050 Units/hr (10/17/17 1015)  . levETIRAcetam Stopped (10/17/17 1610)    Assessment: 62 year old female receiving anticoagulation with heparin for NSTEMI. Heparin level is now therapeutic at 0.38. No signs of bleeding noted.   Goal of Therapy:  Heparin level 0.3-0.7 units/ml Monitor platelets by anticoagulation protocol: Yes   Plan:  Continue heparin at 1050 units/hr Daily heparin level and CBC Monitor for signs/symptoms of bleeding   Jimmy Footman, PharmD, BCPS PGY2 Infectious Diseases Pharmacy Resident Pager: [phone]  10/17/2017, 8:35 PM

## 2017-10-17 NOTE — Progress Notes (Signed)
NP for neurosurgery team in to visit patient. After she left pt voice concerns and fears. She is adamant that she is not going to have surgical intervention. She shared that her 2 sisters died from caner and their last days were "Hell". She asked if it was really work it.  Allowed patient to vent. Encourage pt to speak with her husband to talk about next steps. She is receptive to going to [facility] to hear options  Spoke with MD in regards to a diet for the patient.  Orders given. Spoke with MD. Updated pt. Cont with plan of care

## 2017-10-17 NOTE — Progress Notes (Signed)
ANTICOAGULATION CONSULT NOTE - Follow Up Consult  Pharmacy Consult for Heparin Indication: chest pain/ACS  No Known Allergies  Patient Measurements: Height: 5\' 3"  (160 cm) Weight: 155 lb 6.8 oz (70.5 kg) IBW/kg (Calculated) : 52.4 Heparin Dosing Weight: 67 kg  Vital Signs: Temp: 98.2 F (36.8 C) (05/18 0800) Temp Source: Oral (05/18 0800) BP: 104/78 (05/18 0933) Pulse Rate: 110 (05/18 0933)  Labs: Recent Labs    10/16/17 2336 10/17/17 0640 10/17/17 1210  HGB 14.8  --   --   HCT 42.5  --   --   PLT 352  --   --   APTT  --  72*  --   LABPROT  --  14.8  --   INR  --  1.17  --   HEPARINUNFRC  --   --  0.26*  CREATININE 1.41* 0.86  --   TROPONINI 4.52* 4.21*  --     Estimated Creatinine Clearance: 64.6 mL/min (by C-G formula based on SCr of 0.86 mg/dL).   Medications:  Infusions:  . sodium chloride 125 mL/hr at 10/17/17 0444  . heparin 900 Units/hr (10/17/17 0617)  . levETIRAcetam Stopped (10/17/17 9470)    Assessment: 62 year old female receiving anticoagulation with heparin for NSTEMI. Her initial heparin level is subtherapeutic.  No bleeding noted.  Goal of Therapy:  Heparin level 0.3-0.7 units/ml Monitor platelets by anticoagulation protocol: Yes   Plan:  Increase heparin to 1050 units/hr Check heparin level in 6 hours Daily heparin level and CBC  Legrand Como, Pharm.D., BCPS, BCIDP Clinical Pharmacist Phone: [phone] or [phone] 10/17/2017, 1:11 PM

## 2017-10-17 NOTE — Progress Notes (Signed)
ANTICOAGULATION CONSULT NOTE - Initial Consult  Pharmacy Consult for heparin Indication: chest pain/ACS  No Known Allergies  Patient Measurements: Height: 5\' 3"  (160 cm) Weight: 155 lb 6.8 oz (70.5 kg) IBW/kg (Calculated) : 52.4 Heparin Dosing Weight: 67 kg  Vital Signs: Temp: 99.5 F (37.5 C) (05/17 2240) Temp Source: Oral (05/17 2240) BP: 98/85 (05/17 2240) Pulse Rate: 123 (05/17 2240)  Labs: Recent Labs    10/16/17 2336  HGB 14.8  HCT 42.5  PLT 352  CREATININE 1.41*  TROPONINI 4.52*    Estimated Creatinine Clearance: 39.4 mL/min (A) (by C-G formula based on SCr of 1.41 mg/dL (H)).   Medical History: History reviewed. No pertinent past medical history.  Medications:  No meds PTA  Assessment: 62 yo lady with intracranial mass and elevated troponin to start heparin.  Her MRI did not show evidence of hemorrhage. Goal of Therapy:  Heparin level 0.3-0.7 units/ml Monitor platelets by anticoagulation protocol: Yes   Plan:  Heparin bolus 2000 units and drip at 900 units/hr Check heparin level in 6 hours Daily heparin level and CBC while on heparin Monitor for bleeding complications  Tiah Heckel [place] 10/17/2017,5:40 AM

## 2017-10-17 NOTE — Consult Note (Signed)
Reason for Consult: brain tumor   Referring Physician: Dr. Kelli Churn Diana [name] is an 62 y.o. female.   HPI:  62 year old patient presented to [facility] after an episode of right hand shaking uncontrollably and difficulty speaking. She states that the episode only last for about 58minutes and then resolved. She denies any HA, NV or vision changes. She has never experienced any episodes like this before. Cards is working her up for a nonstemi as well because her troponins were elevated  History reviewed. No pertinent past medical history.  Past Surgical History:  Procedure Laterality Date  . CHOLECYSTECTOMY  1997    No Known Allergies  Social History   Tobacco Use  . Smoking status: Never Smoker  . Smokeless tobacco: Never Used  Substance Use Topics  . Alcohol use: Never    Frequency: Never    History reviewed. No pertinent family history.   Review of Systems  Positive ROS: negative  All other systems have been reviewed and were otherwise negative with the exception of those mentioned in the HPI and as above.  Objective: Vital signs in last 24 hours: Temp:  [98.2 F (36.8 C)-99.5 F (37.5 C)] 98.2 F (36.8 C) (05/18 0800) Pulse Rate:  [110-123] 110 (05/18 0933) Resp:  [18] 18 (05/18 0800) BP: (98-104)/(76-85) 104/78 (05/18 0933) SpO2:  [98 %] 98 % (05/18 0800) Weight:  [155 lb 6.8 oz (70.5 kg)] 155 lb 6.8 oz (70.5 kg) (05/17 2240)  General Appearance: Alert, cooperative, no distress, appears stated age Head: Normocephalic, without obvious abnormality, atraumatic Eyes: PERRL, conjunctiva/corneas clear, EOM's intact, fundi benign, both eyes      Lungs: respirations unlabored Heart: Regular rate and rhythm Extremities: Extremities normal, atraumatic, no cyanosis or edema Pulses: 2+ and symmetric all extremities Skin: Skin color, texture, turgor normal, no rashes or lesions  NEUROLOGIC:   Mental status: A&O x4, no aphasia, good attention span, Memory and fund of  knowledge Motor Exam - grossly normal, normal tone and bulk Sensory Exam - grossly normal Reflexes: symmetric, no pathologic reflexes, No Hoffman's, No clonus Coordination - grossly normal Gait - grossly normal Balance - grossly normal Cranial Nerves: I: smell Not tested  II: visual acuity  OS: na  OD: na  II: visual fields Full to confrontation  II: pupils Equal, round, reactive to light  III,VII: ptosis None  III,IV,VI: extraocular muscles  Full ROM  V: mastication Normal  V: facial light touch sensation  Normal  V,VII: corneal reflex  Present  VII: facial muscle function - upper  Normal  VII: facial muscle function - lower Normal  VIII: hearing Not tested  IX: soft palate elevation  Normal  IX,X: gag reflex Present  XI: trapezius strength  5/5  XI: sternocleidomastoid strength 5/5  XI: neck flexion strength  5/5  XII: tongue strength  Normal    Data Review Lab Results  Component Value Date   WBC 15.6 (H) 10/16/2017   HGB 14.8 10/16/2017   HCT 42.5 10/16/2017   MCV 85.2 10/16/2017   PLT 352 10/16/2017   Lab Results  Component Value Date   NA 138 10/17/2017   K 3.7 10/17/2017   CL 104 10/17/2017   CO2 19 (L) 10/17/2017   BUN 15 10/17/2017   CREATININE 0.86 10/17/2017   GLUCOSE 346 (H) 10/17/2017   Lab Results  Component Value Date   INR 1.17 10/17/2017    Radiology: Mr Jeri Cos CN Contrast  Result Date: 10/17/2017 CLINICAL DATA:  Follow-up examination for intracranial mass. EXAM: MRI HEAD WITHOUT AND WITH CONTRAST TECHNIQUE: Multiplanar, multiecho pulse sequences of the brain and surrounding structures were obtained without and with intravenous contrast. CONTRAST:  71mL MULTIHANCE GADOBENATE DIMEGLUMINE 529 MG/ML IV SOLN COMPARISON:  Prior CT from 10/16/2017. FINDINGS: Brain: Abnormal T2/FLAIR signal abnormality seen involving the parasagittal left frontal lobe, cingulate gyrus (series 11001, image 18). Area of involvement measures approximately 4.6 x 1.3 cm in  size. There is a superimposed area of abnormal postcontrast enhancement within this region, measuring 1.5 x 1.2 x 1.3 cm (series 17001, image 45). This area postcontrast enhancement corresponds with abnormality seen on prior CT. Signal changes are intra-axial in location. No associated susceptibility artifact or intrinsic T1 shortening to suggest hemorrhage. 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 Superimposed postictal changes may be contributory as well. While subacute ischemia could conceivably have this appearance, this is less favored given the enhancement pattern in appearance on prior CT. No other focal parenchymal signal abnormality identified. Cerebral volume is normal for age. No significant cerebral white matter changes. No other evidence for acute or subacute ischemia. Gray-white matter differentiation otherwise well maintained. No encephalomalacia to suggest chronic infarction. No foci of susceptibility artifact to suggest acute or chronic intracranial hemorrhage. No other mass lesion, midline shift, or mass effect. No hydrocephalus. No extra-axial fluid collection. Major dural sinuses are grossly patent. Pituitary gland and suprasellar region normal. Midline structures intact and normal. Vascular: Major intracranial vascular flow voids are maintained. Skull and upper cervical spine: Craniocervical junction within normal limits. Visualized upper cervical spine normal. Bone marrow signal intensity within normal limits. No scalp soft tissue abnormality. Sinuses/Orbits: Globes orbital soft tissues within normal limits. Paranasal sinuses are largely clear. No mastoid effusion. Inner ear structures normal. Other: None. IMPRESSION: 1. Abnormal signal intensity with superimposed 1.5 x 1.2 x 1.3 cm area of abnormal enhancement involving the left cingulate parasagittal left frontal lobe as above. 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 Superimposed postictal changes may be  contributory as well. While subacute ischemia could conceivably have this appearance, this is less favored given the enhancement pattern in appearance on prior CT. No associated hemorrhage or mass effect. 2. Otherwise normal brain MRI. Electronically Signed   By: Jeannine Boga M.D.   On: 10/17/2017 05:26    Assessment/Plan: 62 year old patient presented today after what was thought to be a seizure and some expressive aphasia. MRI revealed a abnormal enhancement involving the left cingulate parasagittal left frontal lobe suggestive of a primary CNS neoplasm. Her symptoms have since resolved. She is currently on heparin gtt for her nonstemi. According to referring MD, neurosurgery was consulted at [facility] when she was at [place]. No emergent intervention needed at this time as patient is stable. We will have her follow up with the consulting neurosurgeon at [facility] after hospitalization for possible biopsy.    Ocie Cornfield [facility] 10/17/2017 9:43 AM

## 2017-10-18 DIAGNOSIS — G939 Disorder of brain, unspecified: Secondary | ICD-10-CM

## 2017-10-18 DIAGNOSIS — I214 Non-ST elevation (NSTEMI) myocardial infarction: Principal | ICD-10-CM

## 2017-10-18 DIAGNOSIS — I5181 Takotsubo syndrome: Secondary | ICD-10-CM

## 2017-10-18 LAB — COMPREHENSIVE METABOLIC PANEL
ALT: 22 U/L (ref 14–54)
AST: 37 U/L (ref 15–41)
Albumin: 3.1 g/dL — ABNORMAL LOW (ref 3.5–5.0)
Alkaline Phosphatase: 80 U/L (ref 38–126)
Anion gap: 12 (ref 5–15)
BUN: 14 mg/dL (ref 6–20)
CHLORIDE: 107 mmol/L (ref 101–111)
CO2: 17 mmol/L — AB (ref 22–32)
CREATININE: 0.7 mg/dL (ref 0.44–1.00)
Calcium: 8.7 mg/dL — ABNORMAL LOW (ref 8.9–10.3)
GFR calc Af Amer: >60 mL/min (ref >60)
GLUCOSE: 359 mg/dL — AB (ref 65–99)
Potassium: 3.8 mmol/L (ref 3.5–5.1)
Sodium: 136 mmol/L (ref 135–145)
Total Bilirubin: 0.7 mg/dL (ref 0.3–1.2)
Total Protein: 6.1 g/dL — ABNORMAL LOW (ref 6.5–8.1)

## 2017-10-18 LAB — GLUCOSE, CAPILLARY
GLUCOSE-CAPILLARY: 273 mg/dL — AB (ref 65–99)
GLUCOSE-CAPILLARY: 292 mg/dL — AB (ref 65–99)
Glucose-Capillary: 226 mg/dL — ABNORMAL HIGH (ref 65–99)

## 2017-10-18 LAB — CBC
HCT: 40.1 % (ref 36.0–46.0)
HEMOGLOBIN: 13.1 g/dL (ref 12.0–15.0)
MCH: 29.6 pg (ref 26.0–34.0)
MCHC: 32.7 g/dL (ref 30.0–36.0)
MCV: 90.5 fL (ref 78.0–100.0)
PLATELETS: 259 10*3/uL (ref 150–400)
RBC: 4.43 MIL/uL (ref 3.87–5.11)
RDW: 12.4 % (ref 11.5–15.5)
WBC: 10.6 10*3/uL — ABNORMAL HIGH (ref 4.0–10.5)

## 2017-10-18 LAB — HEPARIN LEVEL (UNFRACTIONATED): HEPARIN UNFRACTIONATED: 0.39 [IU]/mL (ref 0.30–0.70)

## 2017-10-18 MED ORDER — SODIUM CHLORIDE 0.9 % WEIGHT BASED INFUSION: 1.0000 mL/kg/h | INTRAVENOUS | Status: DC

## 2017-10-18 MED ORDER — SODIUM CHLORIDE 0.9 % WEIGHT BASED INFUSION
3.0000 mL/kg/h | INTRAVENOUS | Status: DC
  Administered 2017-10-19: 3 mL/kg/h via INTRAVENOUS

## 2017-10-18 MED ORDER — SODIUM CHLORIDE 0.9% FLUSH: 3.0000 mL | INTRAVENOUS | Status: DC | PRN

## 2017-10-18 MED ORDER — INSULIN GLARGINE 100 UNIT/ML ~~LOC~~ SOLN
15.0000 [IU] | Freq: Once | SUBCUTANEOUS | Status: AC
  Administered 2017-10-18: 15 [IU] via SUBCUTANEOUS
  Filled 2017-10-18: qty 0.15

## 2017-10-18 MED ORDER — SODIUM CHLORIDE 0.9% FLUSH
3.0000 mL | Freq: Two times a day (BID) | INTRAVENOUS | Status: DC
  Administered 2017-10-18: 3 mL via INTRAVENOUS

## 2017-10-18 MED ORDER — SODIUM CHLORIDE 0.9 % IV SOLN: 250.0000 mL | INTRAVENOUS | Status: DC | PRN

## 2017-10-18 MED ORDER — ASPIRIN 81 MG PO CHEW
81.0000 mg | CHEWABLE_TABLET | ORAL | Status: AC
  Administered 2017-10-19: 81 mg via ORAL
  Filled 2017-10-18: qty 1

## 2017-10-18 NOTE — Progress Notes (Signed)
Progress Note  Patient Name: Diana [name] Date of Encounter: 2020-12-717  Primary Cardiologist: No primary care provider on file.   Subjective   Nuys any further chest pain  Inpatient Medications    Scheduled Meds: . aspirin EC  81 mg Oral Daily  . atorvastatin  80 mg Oral q1800  . insulin aspart  0-15 Units Subcutaneous TID WC  . insulin aspart  0-5 Units Subcutaneous QHS  . metoprolol tartrate  12.5 mg Oral BID   Continuous Infusions: . heparin 1,050 Units/hr (10/18/17 2671)  . levETIRAcetam Stopped (10/18/17 0910)   PRN Meds:    Vital Signs    Vitals:   10/17/17 2009 10/17/17 2119 10/18/17 0458 10/18/17 0901  BP: 94/70 98/65 96/71  (!) 104/58  Pulse: 98 (!) 104 96 98  Resp: 18  18   Temp: 98.9 F (37.2 C)  97.8 F (36.6 C)   TempSrc: Oral  Oral   SpO2: 100%  98%   Weight:   161 lb 4.8 oz (73.2 kg)   Height:        Intake/Output Summary (Last 24 hours) at 2020-12-717 1009 Last data filed at 2020-12-717 0900 Gross per 24 hour  Intake 2861.58 ml  Output -  Net 2861.58 ml   Filed Weights   10/16/17 2240 10/18/17 0458  Weight: 155 lb 6.8 oz (70.5 kg) 161 lb 4.8 oz (73.2 kg)    Telemetry    NSR - Personally Reviewed  ECG    No new EKG to review - Personally Reviewed  Physical Exam   GEN: No acute distress.   Neck: No JVD Cardiac: RRR, no murmurs, rubs, or gallops.  Respiratory: Clear to auscultation bilaterally. GI: Soft, nontender, non-distended  MS: No edema; No deformity. Neuro:  Nonfocal  Psych: Normal affect   Labs    Chemistry Recent Labs  Lab 10/16/17 2336 10/17/17 0640 10/18/17 0518  NA 134* 138 136  K 4.4 3.7 3.8  CL 101 104 107  CO2 22 19* 17*  GLUCOSE 491* 346* 359*  BUN 17 15 14   CREATININE 1.41* 0.86 0.70  CALCIUM 9.5 9.0 8.7*  PROT 6.7  --  6.1*  ALBUMIN 3.7  --  3.1*  AST 30  --  37  ALT 16  --  22  ALKPHOS 81  --  80  BILITOT 0.9  --  0.7  GFRNONAA 39* >60 >60  GFRAA 46* >60 >60  ANIONGAP 11 15 12       Hematology Recent Labs  Lab 10/16/17 2336 10/18/17 0518  WBC 15.6* 10.6*  RBC 4.99 4.43  HGB 14.8 13.1  HCT 42.5 40.1  MCV 85.2 90.5  MCH 29.7 29.6  MCHC 34.8 32.7  RDW 12.2 12.4  PLT 352 259    Cardiac Enzymes Recent Labs  Lab 10/16/17 2336 10/17/17 0640 10/17/17 1210  TROPONINI 4.52* 4.21* 4.26*   No results for input(s): TROPIPOC in the last 168 hours.   BNPNo results for input(s): BNP, PROBNP in the last 168 hours.   DDimer No results for input(s): DDIMER in the last 168 hours.   Radiology    Mr Jeri Cos Wo Contrast  Result Date: 10/17/2017 CLINICAL DATA:  Follow-up examination for intracranial mass. EXAM: MRI HEAD WITHOUT AND WITH CONTRAST TECHNIQUE: Multiplanar, multiecho pulse sequences of the brain and surrounding structures were obtained without and with intravenous contrast. CONTRAST:  29mL MULTIHANCE GADOBENATE DIMEGLUMINE 529 MG/ML IV SOLN COMPARISON:  Prior CT from 10/16/2017. FINDINGS: Brain: Abnormal T2/FLAIR signal abnormality seen  involving the parasagittal left frontal lobe, cingulate gyrus (series 11001, image 18). Area of involvement measures approximately 4.6 x 1.3 cm in size. There is a superimposed area of abnormal postcontrast enhancement within this region, measuring 1.5 x 1.2 x 1.3 cm (series 17001, image 45). This area postcontrast enhancement corresponds with abnormality seen on prior CT. Signal changes are intra-axial in location. No associated susceptibility artifact or intrinsic T1 shortening to suggest hemorrhage. 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 Superimposed postictal changes may be contributory as well. While subacute ischemia could conceivably have this appearance, this is less favored given the enhancement pattern in appearance on prior CT. No other focal parenchymal signal abnormality identified. Cerebral volume is normal for age. No significant cerebral white matter changes. No other evidence for acute or subacute  ischemia. Gray-white matter differentiation otherwise well maintained. No encephalomalacia to suggest chronic infarction. No foci of susceptibility artifact to suggest acute or chronic intracranial hemorrhage. No other mass lesion, midline shift, or mass effect. No hydrocephalus. No extra-axial fluid collection. Major dural sinuses are grossly patent. Pituitary gland and suprasellar region normal. Midline structures intact and normal. Vascular: Major intracranial vascular flow voids are maintained. Skull and upper cervical spine: Craniocervical junction within normal limits. Visualized upper cervical spine normal. Bone marrow signal intensity within normal limits. No scalp soft tissue abnormality. Sinuses/Orbits: Globes orbital soft tissues within normal limits. Paranasal sinuses are largely clear. No mastoid effusion. Inner ear structures normal. Other: None. IMPRESSION: 1. Abnormal signal intensity with superimposed 1.5 x 1.2 x 1.3 cm area of abnormal enhancement involving the left cingulate parasagittal left frontal lobe as above. 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 Superimposed postictal changes may be contributory as well. While subacute ischemia could conceivably have this appearance, this is less favored given the enhancement pattern in appearance on prior CT. No associated hemorrhage or mass effect. 2. Otherwise normal brain MRI. Electronically Signed   By: Jeannine Boga M.D.   On: 10/17/2017 05:26    Cardiac Studies   2D echo 10/17/2017 Study Conclusions  - Left ventricle: The cavity size was normal. There was mild focal   basal hypertrophy of the septum. Systolic function was severely   reduced. The estimated ejection fraction was in the range of 25%   to 30%. There is akinesis of the anteroseptal, anterior,   inferior, and apical myocardium. Doppler parameters are   consistent with abnormal left ventricular relaxation (grade 1   diastolic dysfunction). -  Tricuspid valve: There was moderate regurgitation. - Pulmonary arteries: Systolic pressure was mildly increased. PA   peak pressure: 32 mm Hg (S).  Impressions:  - Akinesis of the mid/distal anterior, septal, inferior and apical   walls with severely reduced LV function; appearance suggestive of   takotsubo cardiomyopathy; mild diastolic dysfunction; moderate TR   with mild pulmonary hypertension; no apical thrombut using   definity but swirling suggestive of low flow state and increased   risk of thrombus formation; would consider anticoagulation.  Patient Profile     62 y.o. female w/ no medical history who presents with seizure like activity, found to have an intracranial mass and subsequently developed CP. Cardiology is consulted for elevated troponin.  Assessment & Plan    1.  NSTEMI -Troponin elevated at 4.21 and 4.26 -2D echocardiogram showed an apical akinesis of the anterior, septal, inferior and apical walls consistent with Takotsubo cardiomyopathy.  EF estimated 25 to 30%. -Troponin likely elevated due to stress MI in the  setting of seizure -Continue to cycle troponin until it peaks -Continue IV heparin for now -Make n.p.o. after midnight -Cardiac catheterization in a.m. to define coronary anatomy but likely this is due to stress MI and echo findings -Continue aspirin 81 mg daily, high-dose statin and Lopressor 12.5 mg twice daily -Cardiac catheterization was discussed with the patient fully. The patient understands that risks include but are not limited to stroke (1 in 1000), death (1 in 65), kidney failure [usually temporary] (1 in 500), bleeding (1 in 200), allergic reaction [possibly serious] (1 in 200).  The patient understands and is willing to proceed.    2.  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      Signed, Fransico Him, MD  2020/09/1417, 10:09 AM

## 2017-10-18 NOTE — Progress Notes (Signed)
ANTICOAGULATION CONSULT NOTE - Follow Up Consult  Pharmacy Consult for Heparin Indication: chest pain/ACS  No Known Allergies  Patient Measurements: Height: 5\' 3"  (160 cm) Weight: 161 lb 4.8 oz (73.2 kg) IBW/kg (Calculated) : 52.4 Heparin Dosing Weight: 67 kg  Vital Signs: Temp: 97.8 F (36.6 C) (05/19 0458) Temp Source: Oral (05/19 0458) BP: 104/58 (05/19 0901) Pulse Rate: 98 (05/19 0901)  Labs: Recent Labs    10/16/17 2336 10/17/17 0640 10/17/17 1210 10/17/17 1934 10/18/17 0518  HGB 14.8  --   --   --  13.1  HCT 42.5  --   --   --  40.1  PLT 352  --   --   --  259  APTT  --  72*  --   --   --   LABPROT  --  14.8  --   --   --   INR  --  1.17  --   --   --   HEPARINUNFRC  --   --  0.26* 0.38 0.39  CREATININE 1.41* 0.86  --   --  0.70  TROPONINI 4.52* 4.21* 4.26*  --   --     Estimated Creatinine Clearance: 70.8 mL/min (by C-G formula based on SCr of 0.7 mg/dL).   Medications:  Infusions:  . heparin 1,050 Units/hr (10/18/17 7253)  . levETIRAcetam 500 mg (10/18/17 0855)    Assessment: 62 year old female receiving anticoagulation with heparin for NSTEMI. Her heparin level is therapeutic.  Note her hemoglobin and platelet count are decreased some from yesterday but no bleeding is noted.  Goal of Therapy:  Heparin level 0.3-0.7 units/ml Monitor platelets by anticoagulation protocol: Yes   Plan:  Continue heparin at 1050 units/hr Daily heparin level and CBC  Legrand Como, Pharm.D., BCPS, BCIDP Clinical Pharmacist Phone: [phone] or [phone] October 06, 202019, 9:17 AM

## 2017-10-18 NOTE — Progress Notes (Signed)
Recd call from central tel in regards to changes in T wave. EKG obtained. MD on unit  He assessed EKG and collaborated with Dr. Radford Pax. We will cont to watch pat.   Pt denies CP and VS are stable.   She is having cath in AM.   Cont with plan of care

## 2017-10-18 NOTE — Progress Notes (Signed)
Patient ID: Diana [name], female   DOB: 1955/08/06, 62 y.o.   MRN: [id number]                                                                PROGRESS NOTE                                                                                                                                                                                                             Patient Demographics:    Diana [name], is a 62 y.o. female, DOB - 04-22-1956, OVF:[id number]  Admit date - 10/16/2017   Admitting Physician Ivor Costa, MD  Outpatient Primary MD for the patient is No primary care provider on file.  LOS - 2  Outpatient Specialists:     No chief complaint on file.      Brief Narrative      62 y.o.femalewithno previously known medical history (hasn't seen a doctor in years) presenting transferred from [facility] hospital.  Was in her usual state of health today when during lunch developed shaking of right hand/arm and inability to control that hand/arm. Attempted to speak to husband but could not. This lasted approximately 10 minutes. Not too long afterward developed substernal chest pressure that resolved after about an hour. First time anything like this has happened. Did not lose consciousness, no loss of bowel or bladder. No history chest pain. Denies SOB or DOE. No known seizure history. Denies toxic habits. Doesn't take any medications. Unaware she has diabetes.  ED Course:CT head, keppra, neurosurg and cardiology consults, EKG  MRI brain 5/18=>  IMPRESSION: 1. Abnormal signal intensity with superimposed 1.5 x 1.2 x 1.3 cm area of abnormal enhancement involving the left cingulate parasagittal left frontal lobe as above. 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 Superimposed postictal changes may be contributory as well. While subacute ischemia could conceivably have this appearance, this is less favored given the enhancement pattern in appearance on prior CT. No associated hemorrhage  or mass effect. 2. Otherwise normal brain MRI.     Subjective:    Diana [name] today has no further seizure or chest pain overnite.  Pt denies fever, chills, palp, sob, n/v, diarrhea, brbpr.   No headache, No new weakness tingling or numbness, No Cough    Assessment  &  Plan :    Active Problems:   Type 2 diabetes mellitus (HCC)   ACS (acute coronary syndrome) (HCC)   Brain mass   Seizure-like activity (HCC)    Acute coronary syndrome - LDL=120 (5/18), peak trop 4.52 (5/18) Chest pain and possible atypical acs symptoms earlier today, in setting of uncontrolled diabetes. Per report, dynamic EKG changes at [facility], troponin normal initially then elevated to 3.98. Initial concern for NSTEMI.  Cardiology consulted Aspirin received at [facility] ? Cont aspirin Cont heparin iv Cont Lipitor 80mg  po qhs (started 5/18) Cont metoprolol 12.5mg  po bid  (hold sbp <100, hr <60) (started 5/18) NPO after Midnite Awaiting cardiac cath Monday   Brain mass, Seizure-like activity-  MRI brain 5/18=> ? Malignancy Keppra loaded at [facility]. No further seizure like activity Continue keppra 500 mg iv bid Neurosurgery consulted 5/18 => rec outpatient f/u with neurosurgery at [facility] since they were called by [facility] ED.   T2DM - new diagnosis, per report a1c at [facility]. Fsbs q4h, ISS   DVT prophylaxis:heparin, SCDS,  Code Status:full Family Communication:w patient this am.  had nice discussion with husband ken Trnka [phone] on 5/18 Disposition Plan:tbd Consults called:cardiology, neurosurgery Admission status:tele, inpatient          Lab Results  Component Value Date   PLT 259 20-Oct-202019    Antibiotics  :  none  Anti-infectives (From admission, onward)   None        Objective:   Vitals:   10/17/17 1600 10/17/17 2009 10/17/17 2119 10/18/17 0458  BP: 95/74 94/70 98/65  96/71  Pulse: 99 98 (!) 104 96  Resp: 18 18  18   Temp: 97.9 F  (36.6 C) 98.9 F (37.2 C)  97.8 F (36.6 C)  TempSrc: Oral Oral  Oral  SpO2: 98% 100%  98%  Weight:    73.2 kg (161 lb 4.8 oz)  Height:        Wt Readings from Last 3 Encounters:  10/18/17 73.2 kg (161 lb 4.8 oz)     Intake/Output Summary (Last 24 hours) at 06-20-2017 0709 Last data filed at 06-20-2017 4665 Gross per 24 hour  Intake 3397.58 ml  Output -  Net 3397.58 ml     Physical Exam  Awake Alert, Oriented X 3, No new F.N deficits, Normal affect [place].AT,PERRAL Supple Neck,No JVD, No cervical lymphadenopathy appriciated.  Symmetrical Chest wall movement, Good air movement bilaterally, CTAB RRR,No Gallops,Rubs or new Murmurs, No Parasternal Heave +ve B.Sounds, Abd Soft, No tenderness, No organomegaly appriciated, No rebound - guarding or rigidity. No Cyanosis, Clubbing or edema, No new Rash or bruise      Data Review:    CBC Recent Labs  Lab 10/16/17 2336 10/18/17 0518  WBC 15.6* 10.6*  HGB 14.8 13.1  HCT 42.5 40.1  PLT 352 259  MCV 85.2 90.5  MCH 29.7 29.6  MCHC 34.8 32.7  RDW 12.2 12.4  LYMPHSABS 2.4  --   MONOABS 1.1*  --   EOSABS 0.0  --   BASOSABS 0.1  --     Chemistries  Recent Labs  Lab 10/16/17 2336 10/17/17 0640 10/18/17 0518  NA 134* 138 136  K 4.4 3.7 3.8  CL 101 104 107  CO2 22 19* 17*  GLUCOSE 491* 346* 359*  BUN 17 15 14   CREATININE 1.41* 0.86 0.70  CALCIUM 9.5 9.0 8.7*  AST 30  --  37  ALT 16  --  22  ALKPHOS 81  --  80  BILITOT 0.9  --  0.7   ------------------------------------------------------------------------------------------------------------------ Recent Labs    10/17/17 0640  CHOL 191  HDL 32*  LDLCALC 120*  TRIG 196*  CHOLHDL 6.0    Lab Results  Component Value Date   HGBA1C 11.2 (H) 10/16/2017   ------------------------------------------------------------------------------------------------------------------ Recent Labs    10/17/17 0640  TSH 5.100*    ------------------------------------------------------------------------------------------------------------------ No results for input(s): VITAMINB12, FOLATE, FERRITIN, TIBC, IRON, RETICCTPCT in the last 72 hours.  Coagulation profile Recent Labs  Lab 10/17/17 0640  INR 1.17    No results for input(s): DDIMER in the last 72 hours.  Cardiac Enzymes Recent Labs  Lab 10/16/17 2336 10/17/17 0640 10/17/17 1210  TROPONINI 4.52* 4.21* 4.26*   ------------------------------------------------------------------------------------------------------------------ No results found for: BNP  Inpatient Medications  Scheduled Meds: . aspirin EC  81 mg Oral Daily  . atorvastatin  80 mg Oral q1800  . insulin aspart  0-15 Units Subcutaneous TID WC  . insulin aspart  0-5 Units Subcutaneous QHS  . metoprolol tartrate  12.5 mg Oral BID   Continuous Infusions: . sodium chloride 125 mL/hr at 10/18/17 0623  . heparin 1,050 Units/hr (10/18/17 0623)  . levETIRAcetam Stopped (10/17/17 2207)   PRN Meds:.  Micro Results No results found for this or any previous visit (from the past 240 hour(s)).  Radiology Reports Mr Jeri Cos Wo Contrast  Result Date: 10/17/2017 CLINICAL DATA:  Follow-up examination for intracranial mass. EXAM: MRI HEAD WITHOUT AND WITH CONTRAST TECHNIQUE: Multiplanar, multiecho pulse sequences of the brain and surrounding structures were obtained without and with intravenous contrast. CONTRAST:  45mL MULTIHANCE GADOBENATE DIMEGLUMINE 529 MG/ML IV SOLN COMPARISON:  Prior CT from 10/16/2017. FINDINGS: Brain: Abnormal T2/FLAIR signal abnormality seen involving the parasagittal left frontal lobe, cingulate gyrus (series 11001, image 18). Area of involvement measures approximately 4.6 x 1.3 cm in size. There is a superimposed area of abnormal postcontrast enhancement within this region, measuring 1.5 x 1.2 x 1.3 cm (series 17001, image 45). This area postcontrast enhancement corresponds  with abnormality seen on prior CT. Signal changes are intra-axial in location. No associated susceptibility artifact or intrinsic T1 shortening to suggest hemorrhage. 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 Superimposed postictal changes may be contributory as well. While subacute ischemia could conceivably have this appearance, this is less favored given the enhancement pattern in appearance on prior CT. No other focal parenchymal signal abnormality identified. Cerebral volume is normal for age. No significant cerebral white matter changes. No other evidence for acute or subacute ischemia. Gray-white matter differentiation otherwise well maintained. No encephalomalacia to suggest chronic infarction. No foci of susceptibility artifact to suggest acute or chronic intracranial hemorrhage. No other mass lesion, midline shift, or mass effect. No hydrocephalus. No extra-axial fluid collection. Major dural sinuses are grossly patent. Pituitary gland and suprasellar region normal. Midline structures intact and normal. Vascular: Major intracranial vascular flow voids are maintained. Skull and upper cervical spine: Craniocervical junction within normal limits. Visualized upper cervical spine normal. Bone marrow signal intensity within normal limits. No scalp soft tissue abnormality. Sinuses/Orbits: Globes orbital soft tissues within normal limits. Paranasal sinuses are largely clear. No mastoid effusion. Inner ear structures normal. Other: None. IMPRESSION: 1. Abnormal signal intensity with superimposed 1.5 x 1.2 x 1.3 cm area of abnormal enhancement involving the left cingulate parasagittal left frontal lobe as above. 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 Superimposed postictal changes may be contributory as well. While subacute ischemia could conceivably have this appearance, this is  less favored given the enhancement pattern in appearance on prior CT. No associated hemorrhage or mass  effect. 2. Otherwise normal brain MRI. Electronically Signed   By: Jeannine Boga M.D.   On: 10/17/2017 05:26    Time Spent in minutes  30   Jani Gravel M.D on 09/09/2017 at 7:09 AM  Between 7am to 7pm - Pager - [phone]  After 7pm go to [email or web address] - password [facility]  [facility] -  Office  [phone]

## 2017-10-18 NOTE — Plan of Care (Signed)
  Problem: Education: Goal: Knowledge of General Education information will improve Outcome: Progressing   

## 2017-10-18 NOTE — H&P (View-Only) (Signed)
Progress Note  Patient Name: Diana [name] Date of Encounter: 09/21/2017  Primary Cardiologist: No primary care provider on file.   Subjective   Nuys any further chest pain  Inpatient Medications    Scheduled Meds: . aspirin EC  81 mg Oral Daily  . atorvastatin  80 mg Oral q1800  . insulin aspart  0-15 Units Subcutaneous TID WC  . insulin aspart  0-5 Units Subcutaneous QHS  . metoprolol tartrate  12.5 mg Oral BID   Continuous Infusions: . heparin 1,050 Units/hr (10/18/17 8416)  . levETIRAcetam Stopped (10/18/17 0910)   PRN Meds:    Vital Signs    Vitals:   10/17/17 2009 10/17/17 2119 10/18/17 0458 10/18/17 0901  BP: 94/70 98/65 96/71  (!) 104/58  Pulse: 98 (!) 104 96 98  Resp: 18  18   Temp: 98.9 F (37.2 C)  97.8 F (36.6 C)   TempSrc: Oral  Oral   SpO2: 100%  98%   Weight:   161 lb 4.8 oz (73.2 kg)   Height:        Intake/Output Summary (Last 24 hours) at 09/21/2017 1009 Last data filed at 09/21/2017 0900 Gross per 24 hour  Intake 2861.58 ml  Output -  Net 2861.58 ml   Filed Weights   10/16/17 2240 10/18/17 0458  Weight: 155 lb 6.8 oz (70.5 kg) 161 lb 4.8 oz (73.2 kg)    Telemetry    NSR - Personally Reviewed  ECG    No new EKG to review - Personally Reviewed  Physical Exam   GEN: No acute distress.   Neck: No JVD Cardiac: RRR, no murmurs, rubs, or gallops.  Respiratory: Clear to auscultation bilaterally. GI: Soft, nontender, non-distended  MS: No edema; No deformity. Neuro:  Nonfocal  Psych: Normal affect   Labs    Chemistry Recent Labs  Lab 10/16/17 2336 10/17/17 0640 10/18/17 0518  NA 134* 138 136  K 4.4 3.7 3.8  CL 101 104 107  CO2 22 19* 17*  GLUCOSE 491* 346* 359*  BUN 17 15 14   CREATININE 1.41* 0.86 0.70  CALCIUM 9.5 9.0 8.7*  PROT 6.7  --  6.1*  ALBUMIN 3.7  --  3.1*  AST 30  --  37  ALT 16  --  22  ALKPHOS 81  --  80  BILITOT 0.9  --  0.7  GFRNONAA 39* >60 >60  GFRAA 46* >60 >60  ANIONGAP 11 15 12       Hematology Recent Labs  Lab 10/16/17 2336 10/18/17 0518  WBC 15.6* 10.6*  RBC 4.99 4.43  HGB 14.8 13.1  HCT 42.5 40.1  MCV 85.2 90.5  MCH 29.7 29.6  MCHC 34.8 32.7  RDW 12.2 12.4  PLT 352 259    Cardiac Enzymes Recent Labs  Lab 10/16/17 2336 10/17/17 0640 10/17/17 1210  TROPONINI 4.52* 4.21* 4.26*   No results for input(s): TROPIPOC in the last 168 hours.   BNPNo results for input(s): BNP, PROBNP in the last 168 hours.   DDimer No results for input(s): DDIMER in the last 168 hours.   Radiology    Mr Jeri Cos Wo Contrast  Result Date: 10/17/2017 CLINICAL DATA:  Follow-up examination for intracranial mass. EXAM: MRI HEAD WITHOUT AND WITH CONTRAST TECHNIQUE: Multiplanar, multiecho pulse sequences of the brain and surrounding structures were obtained without and with intravenous contrast. CONTRAST:  50mL MULTIHANCE GADOBENATE DIMEGLUMINE 529 MG/ML IV SOLN COMPARISON:  Prior CT from 10/16/2017. FINDINGS: Brain: Abnormal T2/FLAIR signal abnormality seen  involving the parasagittal left frontal lobe, cingulate gyrus (series 11001, image 18). Area of involvement measures approximately 4.6 x 1.3 cm in size. There is a superimposed area of abnormal postcontrast enhancement within this region, measuring 1.5 x 1.2 x 1.3 cm (series 17001, image 45). This area postcontrast enhancement corresponds with abnormality seen on prior CT. Signal changes are intra-axial in location. No associated susceptibility artifact or intrinsic T1 shortening to suggest hemorrhage. 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 Superimposed postictal changes may be contributory as well. While subacute ischemia could conceivably have this appearance, this is less favored given the enhancement pattern in appearance on prior CT. No other focal parenchymal signal abnormality identified. Cerebral volume is normal for age. No significant cerebral white matter changes. No other evidence for acute or subacute  ischemia. Gray-white matter differentiation otherwise well maintained. No encephalomalacia to suggest chronic infarction. No foci of susceptibility artifact to suggest acute or chronic intracranial hemorrhage. No other mass lesion, midline shift, or mass effect. No hydrocephalus. No extra-axial fluid collection. Major dural sinuses are grossly patent. Pituitary gland and suprasellar region normal. Midline structures intact and normal. Vascular: Major intracranial vascular flow voids are maintained. Skull and upper cervical spine: Craniocervical junction within normal limits. Visualized upper cervical spine normal. Bone marrow signal intensity within normal limits. No scalp soft tissue abnormality. Sinuses/Orbits: Globes orbital soft tissues within normal limits. Paranasal sinuses are largely clear. No mastoid effusion. Inner ear structures normal. Other: None. IMPRESSION: 1. Abnormal signal intensity with superimposed 1.5 x 1.2 x 1.3 cm area of abnormal enhancement involving the left cingulate parasagittal left frontal lobe as above. 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 Superimposed postictal changes may be contributory as well. While subacute ischemia could conceivably have this appearance, this is less favored given the enhancement pattern in appearance on prior CT. No associated hemorrhage or mass effect. 2. Otherwise normal brain MRI. Electronically Signed   By: Jeannine Boga M.D.   On: 10/17/2017 05:26    Cardiac Studies   2D echo 10/17/2017 Study Conclusions  - Left ventricle: The cavity size was normal. There was mild focal   basal hypertrophy of the septum. Systolic function was severely   reduced. The estimated ejection fraction was in the range of 25%   to 30%. There is akinesis of the anteroseptal, anterior,   inferior, and apical myocardium. Doppler parameters are   consistent with abnormal left ventricular relaxation (grade 1   diastolic dysfunction). -  Tricuspid valve: There was moderate regurgitation. - Pulmonary arteries: Systolic pressure was mildly increased. PA   peak pressure: 32 mm Hg (S).  Impressions:  - Akinesis of the mid/distal anterior, septal, inferior and apical   walls with severely reduced LV function; appearance suggestive of   takotsubo cardiomyopathy; mild diastolic dysfunction; moderate TR   with mild pulmonary hypertension; no apical thrombut using   definity but swirling suggestive of low flow state and increased   risk of thrombus formation; would consider anticoagulation.  Patient Profile     62 y.o. female w/ no medical history who presents with seizure like activity, found to have an intracranial mass and subsequently developed CP. Cardiology is consulted for elevated troponin.  Assessment & Plan    1.  NSTEMI -Troponin elevated at 4.21 and 4.26 -2D echocardiogram showed an apical akinesis of the anterior, septal, inferior and apical walls consistent with Takotsubo cardiomyopathy.  EF estimated 25 to 30%. -Troponin likely elevated due to stress MI in the  setting of seizure -Continue to cycle troponin until it peaks -Continue IV heparin for now -Make n.p.o. after midnight -Cardiac catheterization in a.m. to define coronary anatomy but likely this is due to stress MI and echo findings -Continue aspirin 81 mg daily, high-dose statin and Lopressor 12.5 mg twice daily -Cardiac catheterization was discussed with the patient fully. The patient understands that risks include but are not limited to stroke (1 in 1000), death (1 in 33), kidney failure [usually temporary] (1 in 500), bleeding (1 in 200), allergic reaction [possibly serious] (1 in 200).  The patient understands and is willing to proceed.    2.  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      Signed, Fransico Him, MD  02/13/202019, 10:09 AM

## 2017-10-19 ENCOUNTER — Encounter (HOSPITAL_COMMUNITY): Payer: Self-pay | Admitting: Cardiovascular Disease

## 2017-10-19 ENCOUNTER — Inpatient Hospital Stay (HOSPITAL_COMMUNITY)
Admission: AD | Disposition: A | Discharge status: Home / Self Care | Payer: Self-pay | Source: Other Acute Inpatient Hospital | Attending: Internal Medicine

## 2017-10-19 HISTORY — PX: LEFT HEART CATH AND CORONARY ANGIOGRAPHY: CATH118249

## 2017-10-19 LAB — GLUCOSE, CAPILLARY
GLUCOSE-CAPILLARY: 277 mg/dL — AB (ref 65–99)
GLUCOSE-CAPILLARY: 247 mg/dL — AB (ref 65–99)
GLUCOSE-CAPILLARY: 241 mg/dL — AB (ref 65–99)
GLUCOSE-CAPILLARY: 277 mg/dL — AB (ref 65–99)
GLUCOSE-CAPILLARY: 189 mg/dL — AB (ref 65–99)

## 2017-10-19 LAB — CBC
HCT: 42.7 % (ref 36.0–46.0)
HEMOGLOBIN: 14.1 g/dL (ref 12.0–15.0)
MCH: 28.8 pg (ref 26.0–34.0)
MCHC: 33 g/dL (ref 30.0–36.0)
MCV: 87.3 fL (ref 78.0–100.0)
Platelets: 272 10*3/uL (ref 150–400)
RBC: 4.89 MIL/uL (ref 3.87–5.11)
RDW: 12.2 % (ref 11.5–15.5)
WBC: 11.3 10*3/uL — AB (ref 4.0–10.5)

## 2017-10-19 LAB — COMPREHENSIVE METABOLIC PANEL
ALT: 35 U/L (ref 14–54)
AST: 42 U/L — AB (ref 15–41)
Albumin: 3.4 g/dL — ABNORMAL LOW (ref 3.5–5.0)
Alkaline Phosphatase: 120 U/L (ref 38–126)
Anion gap: 11 (ref 5–15)
BILIRUBIN TOTAL: 1 mg/dL (ref 0.3–1.2)
BUN: 9 mg/dL (ref 6–20)
CALCIUM: 9 mg/dL (ref 8.9–10.3)
CO2: 21 mmol/L — ABNORMAL LOW (ref 22–32)
Chloride: 104 mmol/L (ref 101–111)
Creatinine, Ser: 0.74 mg/dL (ref 0.44–1.00)
GFR calc Af Amer: >60 mL/min (ref >60)
Glucose, Bld: 271 mg/dL — ABNORMAL HIGH (ref 65–99)
Potassium: 3.7 mmol/L (ref 3.5–5.1)
Sodium: 136 mmol/L (ref 135–145)
TOTAL PROTEIN: 6.9 g/dL (ref 6.5–8.1)

## 2017-10-19 LAB — HEPARIN LEVEL (UNFRACTIONATED): Heparin Unfractionated: 0.28 IU/mL — ABNORMAL LOW (ref 0.30–0.70)

## 2017-10-19 LAB — HIV ANTIBODY (ROUTINE TESTING W REFLEX): HIV SCREEN 4TH GENERATION: NONREACTIVE

## 2017-10-19 SURGERY — LEFT HEART CATH AND CORONARY ANGIOGRAPHY
Anesthesia: LOCAL

## 2017-10-19 MED ORDER — HEPARIN (PORCINE) IN NACL 1000-0.9 UT/500ML-% IV SOLN
INTRAVENOUS | Status: AC
  Filled 2017-10-19: qty 500

## 2017-10-19 MED ORDER — ONDANSETRON HCL 4 MG/2ML IJ SOLN: 4.0000 mg | Freq: Four times a day (QID) | INTRAMUSCULAR | Status: DC | PRN

## 2017-10-19 MED ORDER — HEPARIN (PORCINE) IN NACL 100-0.45 UNIT/ML-% IJ SOLN: 1200.0000 [IU]/h | INTRAMUSCULAR | Status: DC

## 2017-10-19 MED ORDER — HEPARIN SODIUM (PORCINE) 1000 UNIT/ML IJ SOLN
INTRAMUSCULAR | Status: DC | PRN
  Administered 2017-10-19: 3500 [IU] via INTRAVENOUS

## 2017-10-19 MED ORDER — FENTANYL CITRATE (PF) 100 MCG/2ML IJ SOLN
INTRAMUSCULAR | Status: DC | PRN
  Administered 2017-10-19: 25 ug via INTRAVENOUS

## 2017-10-19 MED ORDER — SODIUM CHLORIDE 0.9% FLUSH
3.0000 mL | Freq: Two times a day (BID) | INTRAVENOUS | Status: DC
  Administered 2017-10-19 – 2017-10-21 (×4): 3 mL via INTRAVENOUS

## 2017-10-19 MED ORDER — INSULIN GLARGINE 100 UNIT/ML ~~LOC~~ SOLN
10.0000 [IU] | Freq: Every day | SUBCUTANEOUS | Status: DC
  Administered 2017-10-19: 10 [IU] via SUBCUTANEOUS
  Filled 2017-10-19: qty 0.1

## 2017-10-19 MED ORDER — SODIUM CHLORIDE 0.9% FLUSH: 3.0000 mL | INTRAVENOUS | Status: DC | PRN

## 2017-10-19 MED ORDER — FENTANYL CITRATE (PF) 100 MCG/2ML IJ SOLN
INTRAMUSCULAR | Status: AC
  Filled 2017-10-19: qty 2

## 2017-10-19 MED ORDER — VERAPAMIL HCL 2.5 MG/ML IV SOLN
INTRAVENOUS | Status: AC
  Filled 2017-10-19: qty 2

## 2017-10-19 MED ORDER — ACETAMINOPHEN 325 MG PO TABS: 650.0000 mg | ORAL_TABLET | ORAL | Status: DC | PRN

## 2017-10-19 MED ORDER — VERAPAMIL HCL 2.5 MG/ML IV SOLN
INTRAVENOUS | Status: DC | PRN
  Administered 2017-10-19: 12:00:00 via INTRA_ARTERIAL

## 2017-10-19 MED ORDER — SODIUM CHLORIDE 0.9 % IV SOLN: INTRAVENOUS | Status: AC

## 2017-10-19 MED ORDER — HEPARIN (PORCINE) IN NACL 2-0.9 UNITS/ML
INTRAMUSCULAR | Status: AC | PRN
  Administered 2017-10-19 (×2): 500 mL via INTRA_ARTERIAL

## 2017-10-19 MED ORDER — MIDAZOLAM HCL 2 MG/2ML IJ SOLN
INTRAMUSCULAR | Status: DC | PRN
  Administered 2017-10-19: 1 mg via INTRAVENOUS

## 2017-10-19 MED ORDER — IOHEXOL 350 MG/ML SOLN
INTRAVENOUS | Status: DC | PRN
  Administered 2017-10-19: 50 mL via INTRACARDIAC

## 2017-10-19 MED ORDER — LIDOCAINE HCL (PF) 1 % IJ SOLN
INTRAMUSCULAR | Status: AC
  Filled 2017-10-19: qty 30

## 2017-10-19 MED ORDER — LIDOCAINE HCL (PF) 1 % IJ SOLN
INTRAMUSCULAR | Status: DC | PRN
  Administered 2017-10-19: 2 mL

## 2017-10-19 MED ORDER — MIDAZOLAM HCL 2 MG/2ML IJ SOLN
INTRAMUSCULAR | Status: AC
  Filled 2017-10-19: qty 2

## 2017-10-19 MED ORDER — HEPARIN SODIUM (PORCINE) 1000 UNIT/ML IJ SOLN
INTRAMUSCULAR | Status: AC
  Filled 2017-10-19: qty 1

## 2017-10-19 MED ORDER — FUROSEMIDE 10 MG/ML IJ SOLN
20.0000 mg | Freq: Once | INTRAMUSCULAR | Status: AC
  Administered 2017-10-19: 20 mg via INTRAVENOUS
  Filled 2017-10-19: qty 2

## 2017-10-19 MED ORDER — SODIUM CHLORIDE 0.9 % IV SOLN: 250.0000 mL | INTRAVENOUS | Status: DC | PRN

## 2017-10-19 MED ORDER — ALPRAZOLAM 0.25 MG PO TABS
0.2500 mg | ORAL_TABLET | Freq: Three times a day (TID) | ORAL | Status: DC | PRN
Start: 2017-10-19 — End: 2017-10-21
  Administered 2017-10-19: 0.25 mg via ORAL
  Filled 2017-10-19: qty 1

## 2017-10-19 MED ORDER — DIAZEPAM 5 MG PO TABS
5.0000 mg | ORAL_TABLET | Freq: Four times a day (QID) | ORAL | Status: DC | PRN
  Administered 2017-10-19: 5 mg via ORAL
  Filled 2017-10-19: qty 1

## 2017-10-19 SURGICAL SUPPLY — 15 items
CATH INFINITI 5FR ANG PIGTAIL (CATHETERS) ×1 IMPLANT
CATH OPTITORQUE TIG 4.0 5F (CATHETERS) ×1 IMPLANT
COVER PRB 48X5XTLSCP FOLD TPE (BAG) IMPLANT
COVER PROBE 5X48 (BAG) ×2
DEVICE RAD COMP TR BAND LRG (VASCULAR PRODUCTS) ×1 IMPLANT
GUIDEWIRE INQWIRE 1.5J.035X260 (WIRE) IMPLANT
INQWIRE 1.5J .035X260CM (WIRE) ×2
KIT HEART LEFT (KITS) ×2 IMPLANT
NDL PERC 21GX4CM (NEEDLE) IMPLANT
NEEDLE PERC 21GX4CM (NEEDLE) ×2 IMPLANT
PACK CARDIAC CATHETERIZATION (CUSTOM PROCEDURE TRAY) ×2 IMPLANT
SHEATH RAIN RADIAL 21G 6FR (SHEATH) ×1 IMPLANT
SYR MEDRAD MARK V 150ML (SYRINGE) ×2 IMPLANT
TRANSDUCER W/STOPCOCK (MISCELLANEOUS) ×2 IMPLANT
TUBING CIL FLEX 10 FLL-RA (TUBING) ×2 IMPLANT

## 2017-10-19 NOTE — Progress Notes (Signed)
Inpatient Diabetes Program Recommendations  AACE/ADA: New Consensus Statement on Inpatient Glycemic Control (2015)  Target Ranges:  Prepandial:   less than 140 mg/dL      Peak postprandial:   less than 180 mg/dL (1-2 hours)      Critically ill patients:  140 - 180 mg/dL   Results for [name], Diana (MRN [id number]) as of 10/19/2017 10:43  Ref. Range 2020/07/2217 07:41 2020/07/2217 11:19 2020/07/2217 16:30 2020/07/2217 21:35 10/19/2017 07:34  Glucose-Capillary Latest Ref Range: 65 - 99 mg/dL 277 (H) 273 (H) 226 (H) 292 (H) 247 (H)   Results for [name], Diana (MRN [id number]) as of 10/19/2017 10:43  Ref. Range 10/16/2017 23:36  Hemoglobin A1C Latest Ref Range: 4.8 - 5.6 % 11.2 (H)   Review of Glycemic Control  Diabetes history: None Current orders for Inpatient glycemic control: Novolog Moderate Correction 0-15 units tid + Novolog HS scale 0-5 units  Inpatient Diabetes Program Recommendations:    A1c 11.2% this admission, meeting criteria for New DM diagnosis. Please inform patient of diagnosis. At this level of A1c and other medical issues would recommend insulin and an oral agent at this time and at time of d/c.  Glucose trends in the 200's. Consider low dose basal insulin, Lantus 10-12 units Q24hours.  Thanks,  Tama Headings RN, MSN, BC-ADM, [facility] Inpatient Diabetes Coordinator Team Pager [phone] (8a-5p)

## 2017-10-19 NOTE — Care Management Note (Signed)
Case Management Note  Patient Details  Name: Dolly Harbach MRN: [id number] Date of Birth: 1955-07-08  Subjective/Objective: Pt presented as a transfer from [facility] for Seizure like activity. Pt with chest pain and increased A1c. Pt is without Insurance and she does not work. CM did speak with family in regards to [facility] vs Patient [place] with Pharmacy assistance cost being $4.00-$10.00.                     Action/Plan: CM was able to make an appointment at the [facility] for Oct 28, 2017 @ 1030 with Freeman Caldron PA. Pt can get established for the [employer]. Pt's medications will need to be e-scribed to clinic once stable to transition home. If pt will transition home on Insulin: please look at these numbers for supplies from the [facility]: [facility] and [facility] Number for Diabetics.  . Rx for True Metrix glucose meter -[id number] . Lancets  28 gauge 403709 $2.00 . Strips [id number] . Syringes True Plus syringes 100 in a box.  Meter is free and 50 strips for $10.00 or 100 strips for $20.00    Expected Discharge Date:                  Expected Discharge Plan:  Home/Self Care  In-House Referral:  Financial Counselor  Discharge planning Services  CM Consult, Follow-up appt scheduled, [facility], Medication Assistance  Post Acute Care Choice:  NA Choice offered to:  NA  DME Arranged:  N/A DME Agency:  NA  HH Arranged:  NA HH Agency:  NA  Status of Service:  Completed, signed off  If discussed at [place] of Stay Meetings, dates discussed:    Additional Comments:  Bethena Roys, RN 10/19/2017, 2:33 PM

## 2017-10-19 NOTE — Progress Notes (Signed)
Patient ID: Diana [name], female   DOB: February 09, 1956, 62 y.o.   MRN: [id number] Late entry note:Solid patient around just the morning patient appears to be neurologically stable. She is awake alert oriented strength out of 5 no pronator drift no focal deficits.  MRI scan does show what appears to be either a low-grade tumor possible ischemia event left frontal lobe. [name] that we will need to be investigated in the future 1 patient recovers from her cardiac issues.At this point I would recommend follow-up MRI scan in 6 weeks to make [place] more couple this doesn't not represent an ischemic event and then could proceed forward with biopsy. I will contact neuro oncology to see the patient and evaluate as well.

## 2017-10-19 NOTE — Progress Notes (Signed)
Patient is post heart catheterization and patient understanding that IV heparin no longer needed.  RN paged [facility] about new order to start IV heparin.  Unclear from MD notes why IV heparin was being restarted.  Per Sch[place], on call for [facility] page Cardiology to clarify.  Per Dr. Teena Dunk on call with Cardiology, IV heparin not needed and requested RN discontinue order.

## 2017-10-19 NOTE — Progress Notes (Signed)
Patient ID: Diana [name], female   DOB: 03/20/1956, 62 y.o.   MRN: [id number]   I spoke with Dr. Mickeal Skinner and he is in agreement with follow-up imaging in 6 weeks.  So in patient cleared from medical and cardiology standpoint patient could be discharged schedule follow-up with me in 2-3 weeks and I will coordinate her follow-up MRI scan in follow-up appointments.

## 2017-10-19 NOTE — Progress Notes (Signed)
ANTICOAGULATION CONSULT NOTE - Follow Up Consult  Pharmacy Consult for heparin Indication: NSTEMI  Labs: Recent Labs    10/16/17 2336 10/17/17 0640  10/17/17 1210 10/17/17 1934 10/18/17 0518 10/19/17 0521  HGB 14.8  --   --   --   --  13.1 14.1  HCT 42.5  --   --   --   --  40.1 42.7  PLT 352  --   --   --   --  259 272  APTT  --  72*  --   --   --   --   --   LABPROT  --  14.8  --   --   --   --   --   INR  --  1.17  --   --   --   --   --   HEPARINUNFRC  --   --    < > 0.26* 0.38 0.39 0.28*  CREATININE 1.41* 0.86  --   --   --  0.70 0.74  TROPONINI 4.52* 4.21*  --  4.26*  --   --   --    < > = values in this interval not displayed.    Assessment: 62yo female subtherapeutic on heparin after two levels at lower end of goal; RN reports no gtt issues overnight.  Goal of Therapy:  Heparin level 0.3-0.7 units/ml   Plan:  Will increase heparin gtt by 2 units/kg/hr to 1200 units/hr and check level in 6 hours.    Wynona Neat, PharmD, BCPS  10/19/2017,6:35 AM

## 2017-10-19 NOTE — Progress Notes (Addendum)
Progress Note  Patient Name: Diana [name] Date of Encounter: 10/19/2017  Primary Cardiologist: Fransico Him, MD   Subjective   Could not sleep, kept waking up gasping for breath. SOB and feels chest pressure right now. Has gained 6 lbs since admission.   Inpatient Medications    Scheduled Meds: . aspirin EC  81 mg Oral Daily  . atorvastatin  80 mg Oral q1800  . insulin aspart  0-15 Units Subcutaneous TID WC  . insulin aspart  0-5 Units Subcutaneous QHS  . metoprolol tartrate  12.5 mg Oral BID  . sodium chloride flush  3 mL Intravenous Q12H   Continuous Infusions: . sodium chloride    . sodium chloride 1 mL/kg/hr (10/19/17 0714)  . heparin 1,200 Units/hr (10/19/17 0646)  . levETIRAcetam 500 mg (10/19/17 0847)   PRN Meds:    Vital Signs    Vitals:   10/18/17 0901 10/18/17 1300 10/18/17 2131 10/19/17 0512  BP: (!) 104/58 95/69 104/79 100/78  Pulse: 98 96 (!) 105 (!) 116  Resp:   18 18  Temp:  98.1 F (36.7 C) 98.3 F (36.8 C) 98.3 F (36.8 C)  TempSrc:  Oral Oral Oral  SpO2:  99% 99% 100%  Weight:    161 lb 12.8 oz (73.4 kg)  Height:        Intake/Output Summary (Last 24 hours) at 10/19/2017 0919 Last data filed at 10/19/2017 0602 Gross per 24 hour  Intake 828.34 ml  Output -  Net 828.34 ml   Filed Weights   10/16/17 2240 10/18/17 0458 10/19/17 0512  Weight: 155 lb 6.8 oz (70.5 kg) 161 lb 4.8 oz (73.2 kg) 161 lb 12.8 oz (73.4 kg)    Telemetry    SR, ST, frequent PVCs - Personally Reviewed  ECG    No new EKG to review - Personally Reviewed  Physical Exam   GEN: No acute distress.   Neck: JVD 10 cm Cardiac: RRR, no murmurs, rubs, or gallops.  Respiratory: decreased BS bases, few rales bilaterally. GI: Soft, slightly tender, non-distended  MS: No edema; No deformity. Neuro:  Nonfocal  Psych: Normal affect   Labs    Chemistry Recent Labs  Lab 10/16/17 2336 10/17/17 0640 10/18/17 0518 10/19/17 0521  NA 134* 138 136 136  K 4.4 3.7 3.8 3.7    CL 101 104 107 104  CO2 22 19* 17* 21*  GLUCOSE 491* 346* 359* 271*  BUN 17 15 14 9   CREATININE 1.41* 0.86 0.70 0.74  CALCIUM 9.5 9.0 8.7* 9.0  PROT 6.7  --  6.1* 6.9  ALBUMIN 3.7  --  3.1* 3.4*  AST 30  --  37 42*  ALT 16  --  22 35  ALKPHOS 81  --  80 120  BILITOT 0.9  --  0.7 1.0  GFRNONAA 39* >60 >60 >60  GFRAA 46* >60 >60 >60  ANIONGAP 11 15 12 11      Hematology Recent Labs  Lab 10/16/17 2336 10/18/17 0518 10/19/17 0521  WBC 15.6* 10.6* 11.3*  RBC 4.99 4.43 4.89  HGB 14.8 13.1 14.1  HCT 42.5 40.1 42.7  MCV 85.2 90.5 87.3  MCH 29.7 29.6 28.8  MCHC 34.8 32.7 33.0  RDW 12.2 12.4 12.2  PLT 352 259 272    Cardiac Enzymes Recent Labs  Lab 10/16/17 2336 10/17/17 0640 10/17/17 1210  TROPONINI 4.52* 4.21* 4.26*     Radiology    No results found.  Cardiac Studies   2D echo 10/17/2017  Study Conclusions  - Left ventricle: The cavity size was normal. There was mild focal   basal hypertrophy of the septum. Systolic function was severely   reduced. The estimated ejection fraction was in the range of 25%   to 30%. There is akinesis of the anteroseptal, anterior,   inferior, and apical myocardium. Doppler parameters are   consistent with abnormal left ventricular relaxation (grade 1   diastolic dysfunction). - Tricuspid valve: There was moderate regurgitation. - Pulmonary arteries: Systolic pressure was mildly increased. PA   peak pressure: 32 mm Hg (S).  Impressions:  - Akinesis of the mid/distal anterior, septal, inferior and apical   walls with severely reduced LV function; appearance suggestive of   takotsubo cardiomyopathy; mild diastolic dysfunction; moderate TR   with mild pulmonary hypertension; no apical thrombut using   definity but swirling suggestive of low flow state and increased   risk of thrombus formation; would consider anticoagulation.  Patient Profile     62 y.o. female w/ no medical history who presents with seizure like activity,  found to have an intracranial mass and subsequently developed CP. Cardiology is consulted for elevated troponin.  Assessment & Plan    1.  NSTEMI -Troponin elevated at 4.21 and 4.26 -2D echocardiogram showed an apical akinesis of the anterior, septal, inferior and apical walls consistent with Takotsubo cardiomyopathy.  EF estimated 25 to 30%. -Troponin may be elevated due to stress MI in the setting of seizure -Continue IV heparin for now - is NPO -Cardiac catheterization in a.m. to define coronary anatomy but likely this is due to stress MI and echo findings -Continue aspirin 81 mg daily, high-dose statin and Lopressor 12.5 mg twice daily -Cardiac catheterization was discussed with the patient fully by Dr Radford Pax. The patient understands that risks include but are not limited to stroke (1 in 1000), death (1 in 57), kidney failure [usually temporary] (1 in 500), bleeding (1 in 200), allergic reaction [possibly serious] (1 in 200).  The patient understands and is willing to proceed.    2.  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 Describes orthopnea and PND - will give Lasix 20 mg IV x 1 now, dose lower due to SBP 100s - follow volume status and respirations, hopefully will improve enough for cath later today.   For questions or updates, please contact [place] Please consult [email or web address] for contact info under  Cardiology/STEMI.      Signed, Rosaria Ferries, PA-C  10/19/2017, 9:19 AM

## 2017-10-19 NOTE — Progress Notes (Addendum)
ANTICOAGULATION CONSULT NOTE - Follow Up Consult  Pharmacy Consult for Heparin Indication: chest pain/ACS  No Known Allergies  Patient Measurements: Height: 5\' 3"  (160 cm) Weight: 161 lb 12.8 oz (73.4 kg) IBW/kg (Calculated) : 52.4 Heparin Dosing Weight: 67 kg  Vital Signs: Temp: 98 F (36.7 C) (05/20 1256) Temp Source: Oral (05/20 1256) BP: 88/70 (05/20 1256) Pulse Rate: 93 (05/20 1256)  Labs: Recent Labs    10/16/17 2336 10/17/17 0640  10/17/17 1210 10/17/17 1934 10/18/17 0518 10/19/17 0521  HGB 14.8  --   --   --   --  13.1 14.1  HCT 42.5  --   --   --   --  40.1 42.7  PLT 352  --   --   --   --  259 272  APTT  --  72*  --   --   --   --   --   LABPROT  --  14.8  --   --   --   --   --   INR  --  1.17  --   --   --   --   --   HEPARINUNFRC  --   --    < > 0.26* 0.38 0.39 0.28*  CREATININE 1.41* 0.86  --   --   --  0.70 0.74  TROPONINI 4.52* 4.21*  --  4.26*  --   --   --    < > = values in this interval not displayed.    Estimated Creatinine Clearance: 70.9 mL/min (by C-G formula based on SCr of 0.74 mg/dL).   Medications:  Infusions:  . sodium chloride    . sodium chloride    . heparin 1,200 Units/hr (10/19/17 0646)  . levETIRAcetam 500 mg (10/19/17 0847)    Assessment: 62 year old female s/p cardiac cath to resume heparin for abnormal ECHO. The provider has asked that heparin be resumed ~6 hours post sheath removal. CBC stable overnight. Will resume at the same rate prior to cardiac cath.  Goal of Therapy:  Heparin level 0.3-0.7 units/ml Monitor platelets by anticoagulation protocol: Yes   Plan:  Resume heparin at 1200 units/hr at 2000 tonight Daily heparin level and CBC Monitor for s/sx of bleeding  Georga Bora, PharmD Clinical Pharmacist  10/19/2017 2:19 PM

## 2017-10-19 NOTE — Progress Notes (Signed)
Patient ID: Diana [name], female   DOB: [birth year], 62 y.o.   MRN: [id number]                                                                PROGRESS NOTE                                                                                                                                                                                                             Patient Demographics:    Kymora Sciara, is a 62 y.o. female, DOB - 22-Mar-1956, PJA:[id number]  Admit date - 10/16/2017   Admitting Physician Ivor Costa, MD  Outpatient Primary MD for the patient is No primary care provider on file.  LOS - 3  Outpatient Specialists:     No chief complaint on file.      Brief Narrative      62 y.o.femalewithno previously known medical history (hasn't seen a doctor in years) presenting transferred from  [facility], found to have non-ST elevation MI and intracranial mass. Also found a new onset diabetes      .     Subjective:    Kenneshia Njie today has no further seizure or chest pain overnite.  Pt denies fever, chills, palp, sob, n/v, diarrhea, brbpr.   No headache, No new weakness tingling or numbness, No Cough    Assessment  & Plan :    Active Problems:   Type 2 diabetes mellitus (HCC)   ACS (acute coronary syndrome) (HCC)   Brain mass   Seizure-like activity (HCC)   Non-ST elevation (NSTEMI) myocardial infarction [facility])   Takotsubo cardiomyopathy    Acute coronary syndrome /non-ST elevation MI Troponin elevated this admission, 4.21, 4.26, new onset diabetes 2-D echo shows apical akinesis of the anterior, septal, inferior and apical wall, EF 25-30%,- LDL=120 (5/18),   Scheduled for cardiac cath today, Normal coronary arteries in a right dominant coronary circulation.Severe LV dysfunction consistent with Takotsubo cardiomyopathy with vigorous basal contractility but otherwise severe hypo-to akinesis , medical therapy for optimization and potential anticipated improvement in LV function. Cont  aspirin Cont heparin iv Cont Lipitor 80mg  po qhs (started 5/18) Cont metoprolol 12.5mg  po bid  (hold sbp <100, hr <60) (started 5/18)    Brain mass, Seizure-like activity-  MRI brain 5/18=> ? Malignancy Keppra  loaded at [facility]. No further seizure like activity Continue keppra 500 mg iv bid Neurosurgery consulted 5/18 => rec outpatient f/u with neurosurgery at [facility] with a repeat MRI in 4-6  weeks  T2DM - new diagnosis, hemoglobin A1c 11.2, insulin recommended, started on Lantus 10-12 units every 24 hours Continue SSI while inpatient     DVT prophylaxis:heparin, SCDS,  Code Status:full Family Communication:  discussion with husband ken Amrhein [phone] on 5/18 Disposition Plan: Anticipate discharge tomorrow if Accu-Cheks are stable and cleared by cardiology Consults called:cardiology, neurosurgery            Lab Results  Component Value Date   PLT 272 10/19/2017    Antibiotics  :  none  Anti-infectives (From admission, onward)   None        Objective:   Vitals:   10/18/17 1300 10/18/17 2131 10/19/17 0512 10/19/17 1139  BP: 95/69 104/79 100/78   Pulse: 96 (!) 105 (!) 116   Resp:  18 18   Temp: 98.1 F (36.7 C) 98.3 F (36.8 C) 98.3 F (36.8 C)   TempSrc: Oral Oral Oral   SpO2: 99% 99% 100% 100%  Weight:   73.4 kg (161 lb 12.8 oz)   Height:        Wt Readings from Last 3 Encounters:  10/19/17 73.4 kg (161 lb 12.8 oz)     Intake/Output Summary (Last 24 hours) at 10/19/2017 1201 Last data filed at 10/19/2017 0602 Gross per 24 hour  Intake 550.36 ml  Output -  Net 550.36 ml     Physical Exam  Awake Alert, Oriented X 3, No new F.N deficits, Normal affect [place].AT,PERRAL Supple Neck,No JVD, No cervical lymphadenopathy appriciated.  Symmetrical Chest wall movement, Good air movement bilaterally, CTAB RRR,No Gallops,Rubs or new Murmurs, No Parasternal Heave +ve B.Sounds, Abd Soft, No tenderness, No organomegaly appriciated,  No rebound - guarding or rigidity. No Cyanosis, Clubbing or edema, No new Rash or bruise      Data Review:    CBC Recent Labs  Lab 10/16/17 2336 10/18/17 0518 10/19/17 0521  WBC 15.6* 10.6* 11.3*  HGB 14.8 13.1 14.1  HCT 42.5 40.1 42.7  PLT 352 259 272  MCV 85.2 90.5 87.3  MCH 29.7 29.6 28.8  MCHC 34.8 32.7 33.0  RDW 12.2 12.4 12.2  LYMPHSABS 2.4  --   --   MONOABS 1.1*  --   --   EOSABS 0.0  --   --   BASOSABS 0.1  --   --     Chemistries  Recent Labs  Lab 10/16/17 2336 10/17/17 0640 10/18/17 0518 10/19/17 0521  NA 134* 138 136 136  K 4.4 3.7 3.8 3.7  CL 101 104 107 104  CO2 22 19* 17* 21*  GLUCOSE 491* 346* 359* 271*  BUN 17 15 14 9   CREATININE 1.41* 0.86 0.70 0.74  CALCIUM 9.5 9.0 8.7* 9.0  AST 30  --  37 42*  ALT 16  --  22 35  ALKPHOS 81  --  80 120  BILITOT 0.9  --  0.7 1.0   ------------------------------------------------------------------------------------------------------------------ Recent Labs    10/17/17 0640  CHOL 191  HDL 32*  LDLCALC 120*  TRIG 196*  CHOLHDL 6.0    Lab Results  Component Value Date   HGBA1C 11.2 (H) 10/16/2017   ------------------------------------------------------------------------------------------------------------------ Recent Labs    10/17/17 0640  TSH 5.100*   ------------------------------------------------------------------------------------------------------------------ No results for input(s): VITAMINB12, FOLATE, FERRITIN, TIBC, IRON, RETICCTPCT in the last 72  hours.  Coagulation profile Recent Labs  Lab 10/17/17 0640  INR 1.17    No results for input(s): DDIMER in the last 72 hours.  Cardiac Enzymes Recent Labs  Lab 10/16/17 2336 10/17/17 0640 10/17/17 1210  TROPONINI 4.52* 4.21* 4.26*   ------------------------------------------------------------------------------------------------------------------ No results found for: BNP  Inpatient Medications  Scheduled Meds: . [MAR Hold]  aspirin EC  81 mg Oral Daily  . [MAR Hold] atorvastatin  80 mg Oral q1800  . [MAR Hold] insulin aspart  0-15 Units Subcutaneous TID WC  . [MAR Hold] insulin aspart  0-5 Units Subcutaneous QHS  . [MAR Hold] metoprolol tartrate  12.5 mg Oral BID  . sodium chloride flush  3 mL Intravenous Q12H   Continuous Infusions: . sodium chloride    . sodium chloride 1 mL/kg/hr (10/19/17 0714)  . heparin 1,200 Units/hr (10/19/17 0646)  . heparin    . [MAR Hold] levETIRAcetam 500 mg (10/19/17 0847)   PRN Meds:.  Micro Results No results found for this or any previous visit (from the past 240 hour(s)).  Radiology Reports Mr Jeri Cos Wo Contrast  Result Date: 10/17/2017 CLINICAL DATA:  Follow-up examination for intracranial mass. EXAM: MRI HEAD WITHOUT AND WITH CONTRAST TECHNIQUE: Multiplanar, multiecho pulse sequences of the brain and surrounding structures were obtained without and with intravenous contrast. CONTRAST:  48mL MULTIHANCE GADOBENATE DIMEGLUMINE 529 MG/ML IV SOLN COMPARISON:  Prior CT from 10/16/2017. FINDINGS: Brain: Abnormal T2/FLAIR signal abnormality seen involving the parasagittal left frontal lobe, cingulate gyrus (series 11001, image 18). Area of involvement measures approximately 4.6 x 1.3 cm in size. There is a superimposed area of abnormal postcontrast enhancement within this region, measuring 1.5 x 1.2 x 1.3 cm (series 17001, image 45). This area postcontrast enhancement corresponds with abnormality seen on prior CT. Signal changes are intra-axial in location. No associated susceptibility artifact or intrinsic T1 shortening to suggest hemorrhage. 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 Superimposed postictal changes may be contributory as well. While subacute ischemia could conceivably have this appearance, this is less favored given the enhancement pattern in appearance on prior CT. No other focal parenchymal signal abnormality identified. Cerebral volume is normal for  age. No significant cerebral white matter changes. No other evidence for acute or subacute ischemia. Gray-white matter differentiation otherwise well maintained. No encephalomalacia to suggest chronic infarction. No foci of susceptibility artifact to suggest acute or chronic intracranial hemorrhage. No other mass lesion, midline shift, or mass effect. No hydrocephalus. No extra-axial fluid collection. Major dural sinuses are grossly patent. Pituitary gland and suprasellar region normal. Midline structures intact and normal. Vascular: Major intracranial vascular flow voids are maintained. Skull and upper cervical spine: Craniocervical junction within normal limits. Visualized upper cervical spine normal. Bone marrow signal intensity within normal limits. No scalp soft tissue abnormality. Sinuses/Orbits: Globes orbital soft tissues within normal limits. Paranasal sinuses are largely clear. No mastoid effusion. Inner ear structures normal. Other: None. IMPRESSION: 1. Abnormal signal intensity with superimposed 1.5 x 1.2 x 1.3 cm area of abnormal enhancement involving the left cingulate parasagittal left frontal lobe as above. 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 Superimposed postictal changes may be contributory as well. While subacute ischemia could conceivably have this appearance, this is less favored given the enhancement pattern in appearance on prior CT. No associated hemorrhage or mass effect. 2. Otherwise normal brain MRI. Electronically Signed   By: Jeannine Boga M.D.   On: 10/17/2017 05:26    Time Spent in minutes  [place] M.D on 10/19/2017 at 12:01 PM  Between 7am to 7pm - Pager - [phone]  After 7pm go to [email or web address] - password [facility]  [facility] -  Office  [phone]

## 2017-10-19 NOTE — Interval H&P Note (Signed)
Cath Lab Visit (complete for each Cath Lab visit)  Clinical Evaluation Leading to the Procedure:   ACS: Yes.    Non-ACS:    Anginal Classification: CCS IV  Anti-ischemic medical therapy: Minimal Therapy (1 class of medications)  Non-Invasive Test Results: No non-invasive testing performed  Prior CABG: No previous CABG      History and Physical Interval Note:  10/19/2017 11:51 AM  Diana [name]  has presented today for surgery, with the diagnosis of NSTEMI  The various methods of treatment have been discussed with the patient and family. After consideration of risks, benefits and other options for treatment, the patient has consented to  Procedure(s): LEFT HEART CATH AND CORONARY ANGIOGRAPHY (N/A) as a surgical intervention .  The patient's history has been reviewed, patient examined, no change in status, stable for surgery.  I have reviewed the patient's chart and labs.  Questions were answered to the patient's satisfaction.     Shelva Majestic

## 2017-10-20 LAB — BASIC METABOLIC PANEL
Anion gap: 11 (ref 5–15)
BUN: 10 mg/dL (ref 6–20)
CALCIUM: 8.9 mg/dL (ref 8.9–10.3)
CO2: 21 mmol/L — AB (ref 22–32)
Chloride: 106 mmol/L (ref 101–111)
Creatinine, Ser: 0.71 mg/dL (ref 0.44–1.00)
GFR calc Af Amer: >60 mL/min (ref >60)
GLUCOSE: 202 mg/dL — AB (ref 65–99)
Potassium: 3.5 mmol/L (ref 3.5–5.1)
Sodium: 138 mmol/L (ref 135–145)

## 2017-10-20 LAB — GLUCOSE, CAPILLARY
GLUCOSE-CAPILLARY: 310 mg/dL — AB (ref 65–99)
Glucose-Capillary: 223 mg/dL — ABNORMAL HIGH (ref 65–99)
Glucose-Capillary: 230 mg/dL — ABNORMAL HIGH (ref 65–99)
Glucose-Capillary: 228 mg/dL — ABNORMAL HIGH (ref 65–99)

## 2017-10-20 LAB — CBC
HEMATOCRIT: 38.6 % (ref 36.0–46.0)
Hemoglobin: 12.5 g/dL (ref 12.0–15.0)
MCH: 28.9 pg (ref 26.0–34.0)
MCHC: 32.4 g/dL (ref 30.0–36.0)
MCV: 89.4 fL (ref 78.0–100.0)
PLATELETS: 275 10*3/uL (ref 150–400)
RBC: 4.32 MIL/uL (ref 3.87–5.11)
RDW: 12.4 % (ref 11.5–15.5)
WBC: 10.2 10*3/uL (ref 4.0–10.5)

## 2017-10-20 MED ORDER — INSULIN STARTER KIT- PEN NEEDLES (ENGLISH)
1.0000 | Freq: Once | Status: DC
  Filled 2017-10-20: qty 1

## 2017-10-20 MED ORDER — LIVING WELL WITH DIABETES BOOK
Freq: Once | Status: AC
  Administered 2017-10-20: 14:00:00
  Filled 2017-10-20: qty 1

## 2017-10-20 MED ORDER — FUROSEMIDE 10 MG/ML IJ SOLN
40.0000 mg | Freq: Once | INTRAMUSCULAR | Status: AC
  Administered 2017-10-20: 40 mg via INTRAVENOUS
  Filled 2017-10-20: qty 4

## 2017-10-20 MED ORDER — INSULIN GLARGINE 100 UNIT/ML ~~LOC~~ SOLN
15.0000 [IU] | Freq: Every day | SUBCUTANEOUS | Status: DC
  Administered 2017-10-20: 15 [IU] via SUBCUTANEOUS
  Filled 2017-10-20 (×2): qty 0.15

## 2017-10-20 MED ORDER — FUROSEMIDE 10 MG/ML IJ SOLN
40.0000 mg | Freq: Two times a day (BID) | INTRAMUSCULAR | Status: DC
  Administered 2017-10-20 – 2017-10-21 (×2): 40 mg via INTRAVENOUS
  Filled 2017-10-20 (×2): qty 4

## 2017-10-20 MED ORDER — METOPROLOL SUCCINATE ER 25 MG PO TB24
12.5000 mg | ORAL_TABLET | Freq: Every day | ORAL | Status: DC
  Administered 2017-10-21: 12.5 mg via ORAL
  Filled 2017-10-20: qty 1

## 2017-10-20 MED ORDER — LISINOPRIL 2.5 MG PO TABS
2.5000 mg | ORAL_TABLET | Freq: Once | ORAL | Status: AC
  Administered 2017-10-20: 2.5 mg via ORAL
  Filled 2017-10-20: qty 1

## 2017-10-20 MED ORDER — INSULIN GLARGINE 100 UNIT/ML ~~LOC~~ SOLN
3.0000 [IU] | Freq: Once | SUBCUTANEOUS | Status: AC
  Administered 2017-10-20: 3 [IU] via SUBCUTANEOUS
  Filled 2017-10-20 (×2): qty 0.03

## 2017-10-20 MED ORDER — ATORVASTATIN CALCIUM 40 MG PO TABS
40.0000 mg | ORAL_TABLET | Freq: Every day | ORAL | Status: DC
  Administered 2017-10-20 – 2017-10-21 (×2): 40 mg via ORAL
  Filled 2017-10-20 (×2): qty 1

## 2017-10-20 MED ORDER — LEVETIRACETAM 500 MG PO TABS
500.0000 mg | ORAL_TABLET | Freq: Two times a day (BID) | ORAL | Status: DC
  Administered 2017-10-20 – 2017-10-21 (×2): 500 mg via ORAL
  Filled 2017-10-20 (×2): qty 1

## 2017-10-20 MED FILL — Heparin Sod (Porcine)-NaCl IV Soln 1000 Unit/500ML-0.9%: INTRAVENOUS | Qty: 1000 | Status: AC

## 2017-10-20 NOTE — Progress Notes (Addendum)
Progress Note  Patient Name: Diana [name] Date of Encounter: 10/20/2017  Primary Cardiologist: Fransico Him, MD   Subjective   Continues to endorse symptoms of exertional dyspnea, orthopnea, PND, abdominal distention and decreased appetite. She is concerned that if she goes home she will just have to come back to the hospital due to her breathing.   Inpatient Medications    Scheduled Meds: . aspirin EC  81 mg Oral Daily  . atorvastatin  80 mg Oral q1800  . insulin aspart  0-15 Units Subcutaneous TID WC  . insulin aspart  0-5 Units Subcutaneous QHS  . insulin glargine  10 Units Subcutaneous QHS  . metoprolol tartrate  12.5 mg Oral BID  . sodium chloride flush  3 mL Intravenous Q12H   Continuous Infusions: . sodium chloride    . levETIRAcetam Stopped (10/19/17 2125)   PRN Meds: sodium chloride, acetaminophen, ALPRAZolam, diazepam, ondansetron (ZOFRAN) IV, sodium chloride flush   Vital Signs    Vitals:   10/19/17 1715 10/19/17 2057 10/20/17 0320 10/20/17 0450  BP: 94/67 95/66  95/70  Pulse: (!) 102 (!) 106  (!) 104  Resp:      Temp: 98.5 F (36.9 C) 98 F (36.7 C)  98.1 F (36.7 C)  TempSrc: Oral Oral  Oral  SpO2: 97% 97%  98%  Weight:   161 lb 11.2 oz (73.3 kg)   Height:        Intake/Output Summary (Last 24 hours) at 10/20/2017 0649 Last data filed at 10/20/2017 0300 Gross per 24 hour  Intake 441 ml  Output 650 ml  Net -209 ml   Filed Weights   10/18/17 0458 10/19/17 0512 10/20/17 0320  Weight: 161 lb 4.8 oz (73.2 kg) 161 lb 12.8 oz (73.4 kg) 161 lb 11.2 oz (73.3 kg)   Telemetry    Sinus rhythm with occasional PVCs - Personally Reviewed  ECG    No new EKG for review - Personally Reviewed  Physical Exam   General: Well nourished female in no acute distress Pulm: Good air movement with no wheezing or crackles  CV: RRR, no murmurs, no rubs  Abdomen: Active bowel sounds, soft, non-distended Extremities: No LE edema  Neuro: Alert and oriented x 3    Labs    Chemistry Recent Labs  Lab 10/16/17 2336 10/17/17 0640 10/18/17 0518 10/19/17 0521  NA 134* 138 136 136  K 4.4 3.7 3.8 3.7  CL 101 104 107 104  CO2 22 19* 17* 21*  GLUCOSE 491* 346* 359* 271*  BUN 17 15 14 9   CREATININE 1.41* 0.86 0.70 0.74  CALCIUM 9.5 9.0 8.7* 9.0  PROT 6.7  --  6.1* 6.9  ALBUMIN 3.7  --  3.1* 3.4*  AST 30  --  37 42*  ALT 16  --  22 35  ALKPHOS 81  --  80 120  BILITOT 0.9  --  0.7 1.0  GFRNONAA 39* >60 >60 >60  GFRAA 46* >60 >60 >60  ANIONGAP 11 15 12 11     Hematology Recent Labs  Lab 10/18/17 0518 10/19/17 0521 10/20/17 0437  WBC 10.6* 11.3* 10.2  RBC 4.43 4.89 4.32  HGB 13.1 14.1 12.5  HCT 40.1 42.7 38.6  MCV 90.5 87.3 89.4  MCH 29.6 28.8 28.9  MCHC 32.7 33.0 32.4  RDW 12.4 12.2 12.4  PLT 259 272 275   Cardiac Enzymes Recent Labs  Lab 10/16/17 2336 10/17/17 0640 10/17/17 1210  TROPONINI 4.52* 4.21* 4.26*   No results for  input(s): TROPIPOC in the last 168 hours.   BNPNo results for input(s): BNP, PROBNP in the last 168 hours.   DDimer No results for input(s): DDIMER in the last 168 hours.   Radiology    No results found.  Cardiac Studies   Left Heart Cath and Coronary Angiography 10/19/17  1. Normal coronary arteries in a right dominant coronary circulation. 2. Severe LV dysfunction consistent with Takotsubo cardiomyopathy with vigorous basal contractility but otherwise severe hypo-to akinesis of the anterolateral wall,  apex and mid distal inferior walls.  Patient Profile     62 y.o. female who presented to the ED with seizure like activity and subsequently found to have an intercranial mass. While hospitalized she developed CP with an elevated troponin.   Assessment & Plan    NSTEMI Dilated Cardiomyopathy (Takotsubo) - Troponin peak at 4.52 before trending down. Will stop heparin today as patient has been on it for >48 hours.   - 2D echocardiogram showed an apical akinesis of the anterior, septal, inferior  and apical walls consistent with Takotsubo cardiomyopathy.  EF estimated 25 to 30%. - LHC on 10/19/2017 illustrating normal coronary arteries with severe LV dysfunction.  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  For questions or updates, please contact [place] Please consult [email or web address] for contact info under Cardiology/STEMI.     Signed, Ina Homes, MD  10/20/2017, 6:49 AM

## 2017-10-20 NOTE — Progress Notes (Addendum)
Patient ID: Diana [name], female   DOB: 13-Jan-1956, 62 y.o.   MRN: [id number]                                                                PROGRESS NOTE                                                                                                                                                                                                             Patient Demographics:    Diana [name], is a 62 y.o. female, DOB - Jul 10, 1955, NTI:[id number]  Admit date - 10/16/2017   Admitting Physician Ivor Costa, MD  Outpatient Primary MD for the patient is Patient, No Pcp Per  LOS - 4  Outpatient Specialists:     No chief complaint on file.      Brief Narrative      62 y.o.femalewithno previously known medical history (hasn't seen a doctor in years) presenting transferred from  [facility], found to have non-ST elevation MI and intracranial mass. Also found a new onset diabetes. Status postcardiac cath 5/20 which showed normal coronaries,Severe LV dysfunction consistent with Takotsubo cardiomyopathy . Now undergoing IV diuresis, anticipate she'll be ready for discharge 5/22      Subjective:    [name]  patient states that she had significant orthopnea and unable to lay flat, she has decreased appetite and abdominal distention  No headache, No new weakness tingling or numbness, No Cough    Assessment  & Plan :    Active Problems:   Type 2 diabetes mellitus (HCC)   ACS (acute coronary syndrome) (HCC)   Brain mass   Seizure-like activity (HCC)   Non-ST elevation (NSTEMI) myocardial infarction [facility])   Takotsubo cardiomyopathy    Acute coronary syndrome /non-ST elevation MI Troponin elevated this admission, 4.21, 4.26, new onset diabetes 2-D echo shows apical akinesis of the anterior, septal, inferior and apical wall, EF 25-30%,- LDL=120 (5/18),   Status post cardiac cath  5/20 , Normal coronary arteries in a right dominant coronary circulation.Severe LV dysfunction consistent with  Takotsubo cardiomyopathy with vigorous basal contractility but otherwise severe hypo-to akinesis , medical therapy for optimization and potential anticipated improvement in LV function. Cont aspirin Completed heparin iv Cont Lipitor 80mg  po qhs (started 5/18), beta blocker, lisinopril Diuresis with IV  Lasix until she is euvolemic, follow renal function closely Repeat 2-D echo in 6 months to address recovery of LV EF    Brain mass, Seizure-like activity-  MRI brain 5/18=> ? Malignancy Keppra loaded at [facility]. No further seizure like activity Continue keppra 500 mg  , change to oral bid Neurosurgery consulted 5/18 => rec outpatient f/u with neurosurgery at [facility] with a repeat MRI in 4-6  weeks  T2DM - new diagnosis, hemoglobin A1c 11.2, insulin recommended, started on Lantus increased to 15 units daily at bedtime Continue SSI while inpatient     DVT prophylaxis:heparin, SCDS,  Code Status:full Family Communication:  discussion with husband ken Felty [phone] on 5/18,bedside 5/20,5/21 Disposition Plan: Anticipate discharge tomorrow  after diuresis, once cleared by cardiology Consults called:cardiology, neurosurgery            Lab Results  Component Value Date   PLT 275 10/20/2017    Antibiotics  :  none  Anti-infectives (From admission, onward)   None        Objective:   Vitals:   10/19/17 2057 10/20/17 0320 10/20/17 0450 10/20/17 0831  BP: 95/66  95/70 110/79  Pulse: (!) 106  (!) 104 (!) 111  Resp:      Temp: 98 F (36.7 C)  98.1 F (36.7 C)   TempSrc: Oral  Oral   SpO2: 97%  98%   Weight:  73.3 kg (161 lb 11.2 oz)    Height:        Wt Readings from Last 3 Encounters:  10/20/17 73.3 kg (161 lb 11.2 oz)     Intake/Output Summary (Last 24 hours) at 10/20/2017 1009 Last data filed at 10/20/2017 0829 Gross per 24 hour  Intake 441 ml  Output 900 ml  Net -459 ml     Physical Exam  Awake Alert, Oriented X 3, No new F.N  deficits, Normal affect [place].AT,PERRAL Supple Neck,No JVD, No cervical lymphadenopathy appriciated.  Symmetrical Chest wall movement, Good air movement bilaterally,  crackles by basilar RRR,No Gallops,Rubs or new Murmurs, No Parasternal Heave +ve B.Sounds, Abd Soft, No tenderness, No organomegaly appriciated, No rebound - guarding or rigidity. No Cyanosis, Clubbing or edema, No new Rash or bruise      Data Review:    CBC Recent Labs  Lab 10/16/17 2336 10/18/17 0518 10/19/17 0521 10/20/17 0437  WBC 15.6* 10.6* 11.3* 10.2  HGB 14.8 13.1 14.1 12.5  HCT 42.5 40.1 42.7 38.6  PLT 352 259 272 275  MCV 85.2 90.5 87.3 89.4  MCH 29.7 29.6 28.8 28.9  MCHC 34.8 32.7 33.0 32.4  RDW 12.2 12.4 12.2 12.4  LYMPHSABS 2.4  --   --   --   MONOABS 1.1*  --   --   --   EOSABS 0.0  --   --   --   BASOSABS 0.1  --   --   --     Chemistries  Recent Labs  Lab 10/16/17 2336 10/17/17 0640 10/18/17 0518 10/19/17 0521 10/20/17 0437  NA 134* 138 136 136 138  K 4.4 3.7 3.8 3.7 3.5  CL 101 104 107 104 106  CO2 22 19* 17* 21* 21*  GLUCOSE 491* 346* 359* 271* 202*  BUN 17 15 14 9 10   CREATININE 1.41* 0.86 0.70 0.74 0.71  CALCIUM 9.5 9.0 8.7* 9.0 8.9  AST 30  --  37 42*  --   ALT 16  --  22 35  --   ALKPHOS 81  --  80 120  --   BILITOT 0.9  --  0.7 1.0  --    ------------------------------------------------------------------------------------------------------------------ No results for input(s): CHOL, HDL, LDLCALC, TRIG, CHOLHDL, LDLDIRECT in the last 72 hours.  Lab Results  Component Value Date   HGBA1C 11.2 (H) 10/16/2017   ------------------------------------------------------------------------------------------------------------------ No results for input(s): TSH, T4TOTAL, T3FREE, THYROIDAB in the last 72 hours.  Invalid input(s): FREET3 ------------------------------------------------------------------------------------------------------------------ No results for input(s):  VITAMINB12, FOLATE, FERRITIN, TIBC, IRON, RETICCTPCT in the last 72 hours.  Coagulation profile Recent Labs  Lab 10/17/17 0640  INR 1.17    No results for input(s): DDIMER in the last 72 hours.  Cardiac Enzymes Recent Labs  Lab 10/16/17 2336 10/17/17 0640 10/17/17 1210  TROPONINI 4.52* 4.21* 4.26*   ------------------------------------------------------------------------------------------------------------------ No results found for: BNP  Inpatient Medications  Scheduled Meds: . aspirin EC  81 mg Oral Daily  . atorvastatin  80 mg Oral q1800  . insulin aspart  0-15 Units Subcutaneous TID WC  . insulin aspart  0-5 Units Subcutaneous QHS  . insulin glargine  10 Units Subcutaneous QHS  . lisinopril  2.5 mg Oral Once  . metoprolol succinate  12.5 mg Oral Daily  . sodium chloride flush  3 mL Intravenous Q12H   Continuous Infusions: . sodium chloride    . levETIRAcetam 500 mg (10/20/17 0829)   PRN Meds:.  Micro Results No results found for this or any previous visit (from the past 240 hour(s)).  Radiology Reports Mr Jeri Cos Wo Contrast  Result Date: 10/17/2017 CLINICAL DATA:  Follow-up examination for intracranial mass. EXAM: MRI HEAD WITHOUT AND WITH CONTRAST TECHNIQUE: Multiplanar, multiecho pulse sequences of the brain and surrounding structures were obtained without and with intravenous contrast. CONTRAST:  33mL MULTIHANCE GADOBENATE DIMEGLUMINE 529 MG/ML IV SOLN COMPARISON:  Prior CT from 10/16/2017. FINDINGS: Brain: Abnormal T2/FLAIR signal abnormality seen involving the parasagittal left frontal lobe, cingulate gyrus (series 11001, image 18). Area of involvement measures approximately 4.6 x 1.3 cm in size. There is a superimposed area of abnormal postcontrast enhancement within this region, measuring 1.5 x 1.2 x 1.3 cm (series 17001, image 45). This area postcontrast enhancement corresponds with abnormality seen on prior CT. Signal changes are intra-axial in location. No  associated susceptibility artifact or intrinsic T1 shortening to suggest hemorrhage. 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 Superimposed postictal changes may be contributory as well. While subacute ischemia could conceivably have this appearance, this is less favored given the enhancement pattern in appearance on prior CT. No other focal parenchymal signal abnormality identified. Cerebral volume is normal for age. No significant cerebral white matter changes. No other evidence for acute or subacute ischemia. Gray-white matter differentiation otherwise well maintained. No encephalomalacia to suggest chronic infarction. No foci of susceptibility artifact to suggest acute or chronic intracranial hemorrhage. No other mass lesion, midline shift, or mass effect. No hydrocephalus. No extra-axial fluid collection. Major dural sinuses are grossly patent. Pituitary gland and suprasellar region normal. Midline structures intact and normal. Vascular: Major intracranial vascular flow voids are maintained. Skull and upper cervical spine: Craniocervical junction within normal limits. Visualized upper cervical spine normal. Bone marrow signal intensity within normal limits. No scalp soft tissue abnormality. Sinuses/Orbits: Globes orbital soft tissues within normal limits. Paranasal sinuses are largely clear. No mastoid effusion. Inner ear structures normal. Other: None. IMPRESSION: 1. Abnormal signal intensity with superimposed 1.5 x 1.2 x 1.3 cm area of abnormal enhancement involving the left cingulate parasagittal left frontal lobe as above. Findings are most  concerning for a primary CNS neoplasm. Superimposed postictal changes may be contributory as well. While subacute ischemia could conceivably have this appearance, this is less favored given the enhancement pattern in appearance on prior CT. No associated hemorrhage or mass effect. 2. Otherwise normal brain MRI. Electronically Signed   By: Jeannine Boga M.D.   On: 10/17/2017 05:26    Time Spent in minutes  30   Reyne Dumas M.D on 10/20/2017 at 10:09 AM  Between 7am to 7pm -   After 7pm go to [email or web address] - password [facility]  [facility] -  Office  [phone]

## 2017-10-20 NOTE — Progress Notes (Signed)
Nutrition Consult Diet Education  RD consulted for nutrition education regarding diabetes.   Lab Results  Component Value Date   HGBA1C 11.2 (H) 10/16/2017    RD provided "Carbohydrate Counting for People with Diabetes" handout from the Academy of Nutrition and Dietetics. Discussed different food groups and their effects on blood sugar, emphasizing carbohydrate-containing foods. Provided list of carbohydrates and recommended serving sizes of common foods.  Discussed importance of controlled and consistent carbohydrate intake throughout the day. Provided examples of ways to balance meals/snacks and encouraged intake of high-fiber, whole grain complex carbohydrates. Teach back method used.  Expect good compliance.  Body mass index is 28.64 kg/m. Pt meets criteria for overweight based on current BMI.  Current diet order is CHO modified, patient is consuming approximately 25-100% of meals at this time. Patient reports that she is feeling a little better and has been eating well until this morning. Plans for d/c home tomorrow. Labs and medications reviewed. No further nutrition interventions warranted at this time. RD contact information provided. If additional nutrition issues arise, please re-consult RD.   Molli Barrows, RD, LDN, [place] Pager [phone] After Hours Pager [phone]

## 2017-10-20 NOTE — Progress Notes (Signed)
Inpatient Diabetes Program   AACE/ADA: New Consensus Statement on Inpatient Glycemic Control (2015)  Target Ranges:  Prepandial:   less than 140 mg/dL      Peak postprandial:   less than 180 mg/dL (1-2 hours)      Critically ill patients:  140 - 180 mg/dL   Spoke with patient about new diabetes diagnosis.  Discussed A1C results (11.2% this admission) and explained what an A1C is and informed patient that his current A1C indicates an average glucose of 269 mg/dl over the past 2-3 months. Discussed basic pathophysiology of DM Type 2, basic home care, importance of checking CBGs and maintaining good CBG control to prevent long-term and short-term complications. Reviewed glucose and A1C goals. Reviewed signs and symptoms of hyperglycemia and hypoglycemia along with treatment for both. Discussed impact of nutrition, exercise, stress, sickness, and medications on diabetes control. Reviewed Living Well with diabetes booklet and encouraged patient to read through entire book. Informed patient that she may be prescribed long acting insulin along with an oral agent.   Asked patient to check her glucose 2 times per day (fasting and alternating second check) and to keep a log book of glucose readings and insulin taken. Explained how the doctor he follows up with can use the log book to continue to make insulin adjustments if needed.   Reviewed and demonstrated how to draw up and administer insulin via insulin pen.   Informed patient that RN will be asking him to self-administer insulin to ensure proper technique and ability to administer self insulin shots. Patient verbalized understanding of information discussed and has no further questions at this time related to diabetes.    RNs to provide ongoing basic DM education at bedside with this patient and engage patient to actively check blood glucose and administer insulin injections.   Patient did have a couple of moments where she said an incorrect statement  and meant something else. Will have Coordinator follow up tomorrow before d/c to clarify questions if needed.  Thanks, Tama Headings RN, MSN, BC-ADM, [facility] Inpatient Diabetes Coordinator Team Pager [phone] (8a-5p)

## 2017-10-21 ENCOUNTER — Telehealth: Payer: Self-pay | Admitting: Cardiology

## 2017-10-21 LAB — BASIC METABOLIC PANEL
ANION GAP: 11 (ref 5–15)
BUN: 10 mg/dL (ref 6–20)
CALCIUM: 9.1 mg/dL (ref 8.9–10.3)
CO2: 24 mmol/L (ref 22–32)
CREATININE: 0.7 mg/dL (ref 0.44–1.00)
Chloride: 104 mmol/L (ref 101–111)
Glucose, Bld: 147 mg/dL — ABNORMAL HIGH (ref 65–99)
Potassium: 3.2 mmol/L — ABNORMAL LOW (ref 3.5–5.1)
SODIUM: 139 mmol/L (ref 135–145)

## 2017-10-21 LAB — CBC
HEMATOCRIT: 38.3 % (ref 36.0–46.0)
Hemoglobin: 12.9 g/dL (ref 12.0–15.0)
MCH: 29.3 pg (ref 26.0–34.0)
MCHC: 33.7 g/dL (ref 30.0–36.0)
MCV: 87 fL (ref 78.0–100.0)
Platelets: 311 10*3/uL (ref 150–400)
RBC: 4.4 MIL/uL (ref 3.87–5.11)
RDW: 12.3 % (ref 11.5–15.5)
WBC: 9.4 10*3/uL (ref 4.0–10.5)

## 2017-10-21 LAB — GLUCOSE, CAPILLARY
GLUCOSE-CAPILLARY: 172 mg/dL — AB (ref 65–99)
GLUCOSE-CAPILLARY: 196 mg/dL — AB (ref 65–99)
Glucose-Capillary: 246 mg/dL — ABNORMAL HIGH (ref 65–99)

## 2017-10-21 MED ORDER — LISINOPRIL 5 MG PO TABS: 2.5000 mg | ORAL_TABLET | Freq: Every day | ORAL | 2 refills | Status: DC

## 2017-10-21 MED ORDER — INSULIN STARTER KIT- PEN NEEDLES (ENGLISH): 1.0000 | Freq: Once | 0 refills | Status: DC

## 2017-10-21 MED ORDER — INSULIN GLARGINE 100 UNIT/ML ~~LOC~~ SOLN: 15.0000 [IU] | Freq: Every day | SUBCUTANEOUS | 11 refills | Status: DC

## 2017-10-21 MED ORDER — BLOOD GLUCOSE METER KIT: PACK | 3 refills | Status: DC

## 2017-10-21 MED ORDER — POTASSIUM CHLORIDE ER 20 MEQ PO TBCR: 20.0000 meq | EXTENDED_RELEASE_TABLET | Freq: Every day | ORAL | 1 refills | Status: DC

## 2017-10-21 MED ORDER — INSULIN ASPART 100 UNIT/ML ~~LOC~~ SOLN: 0.0000 [IU] | Freq: Three times a day (TID) | SUBCUTANEOUS | 11 refills | Status: DC

## 2017-10-21 MED ORDER — LEVETIRACETAM 500 MG PO TABS: 500.0000 mg | ORAL_TABLET | Freq: Two times a day (BID) | ORAL | 2 refills | Status: DC

## 2017-10-21 MED ORDER — ATORVASTATIN CALCIUM 40 MG PO TABS: 40.0000 mg | ORAL_TABLET | Freq: Every day | ORAL | 2 refills | Status: DC

## 2017-10-21 MED ORDER — METOPROLOL SUCCINATE ER 25 MG PO TB24: 12.5000 mg | ORAL_TABLET | Freq: Every day | ORAL | 2 refills | Status: DC

## 2017-10-21 MED ORDER — POTASSIUM CHLORIDE CRYS ER 20 MEQ PO TBCR
40.0000 meq | EXTENDED_RELEASE_TABLET | Freq: Two times a day (BID) | ORAL | Status: DC
  Administered 2017-10-21: 40 meq via ORAL
  Filled 2017-10-21: qty 2

## 2017-10-21 MED ORDER — ASPIRIN 81 MG PO TBEC: 81.0000 mg | DELAYED_RELEASE_TABLET | Freq: Every day | ORAL | 1 refills | Status: DC

## 2017-10-21 MED ORDER — INSULIN LISPRO 100 UNIT/ML (KWIKPEN): PEN_INJECTOR | SUBCUTANEOUS | 3 refills | Status: DC

## 2017-10-21 MED ORDER — FUROSEMIDE 40 MG PO TABS: 40.0000 mg | ORAL_TABLET | Freq: Two times a day (BID) | ORAL | 2 refills | Status: DC

## 2017-10-21 MED ORDER — "PEN NEEDLES 3/16"" 31G X 5 MM MISC": 1.0000 [IU] | Freq: Three times a day (TID) | 1 refills | Status: DC

## 2017-10-21 MED ORDER — INSULIN GLARGINE 100 UNIT/ML SOLOSTAR PEN: PEN_INJECTOR | SUBCUTANEOUS | 11 refills | Status: DC

## 2017-10-21 MED ORDER — INSULIN ASPART 100 UNIT/ML ~~LOC~~ SOLN: SUBCUTANEOUS | 3 refills | Status: DC

## 2017-10-21 MED FILL — !HUMALOG 100 UNITS/ML KWIKP: 100 | 20 days supply | Qty: 9 | Fill #0

## 2017-10-21 MED FILL — TRUE METRIX TEST STRIP: 25 days supply | Qty: 100 | Fill #0

## 2017-10-21 MED FILL — POTASSIUM CL ER 20 MEQ TAB: 20 | 30 days supply | Qty: 30 | Fill #0

## 2017-10-21 MED FILL — ATORVASTATIN CALCIUM 40 MG: 40 | 30 days supply | Qty: 30 | Fill #0

## 2017-10-21 MED FILL — LISINOPRIL 5 MG TAB: 5 | 30 days supply | Qty: 15 | Fill #0

## 2017-10-21 MED FILL — FUROSEMIDE 40 MG TAB: 40 | 30 days supply | Qty: 60 | Fill #0

## 2017-10-21 MED FILL — TRUEplus LANCETS 28G MISC: 25 days supply | Qty: 100 | Fill #0

## 2017-10-21 MED FILL — !TRUE METRIX BLOOD GLUCOSE: 365 days supply | Qty: 1 | Fill #0

## 2017-10-21 MED FILL — levETIRAcetam 500 MG TABS: 500 | 30 days supply | Qty: 60 | Fill #0

## 2017-10-21 MED FILL — !LANTUS SOLOSTAR 100UNITS/M: 100 | 20 days supply | Qty: 3 | Fill #0

## 2017-10-21 MED FILL — METOPROLOL SUCCINATE ER 25: 25 | 30 days supply | Qty: 15 | Fill #0

## 2017-10-21 NOTE — Progress Notes (Signed)
Progress Note  Patient Name: Diana [name] Date of Encounter: 10/21/2017  Primary Cardiologist: Fransico Him, MD   Subjective   Patient with significant improvement over the interval. She denies orthopnea and PND this AM. Exertional dyspnea is improved. Appetite improved. Asking when she can go home.   Inpatient Medications    Scheduled Meds: . aspirin EC  81 mg Oral Daily  . atorvastatin  40 mg Oral q1800  . furosemide  40 mg Intravenous Q12H  . insulin aspart  0-15 Units Subcutaneous TID WC  . insulin aspart  0-5 Units Subcutaneous QHS  . insulin glargine  15 Units Subcutaneous QHS  . insulin starter kit- pen needles  1 kit Other Once  . levETIRAcetam  500 mg Oral BID  . metoprolol succinate  12.5 mg Oral Daily  . sodium chloride flush  3 mL Intravenous Q12H   Continuous Infusions: . sodium chloride     PRN Meds: sodium chloride, acetaminophen, ALPRAZolam, diazepam, ondansetron (ZOFRAN) IV, sodium chloride flush   Vital Signs    Vitals:   10/20/17 1249 10/20/17 1347 10/20/17 2000 10/21/17 0437  BP: 115/84 91/75 90/68 103/84  Pulse:  (!) 101 98 97  Resp:   18 18  Temp:  97.7 F (36.5 C) 98.3 F (36.8 C) 97.8 F (36.6 C)  TempSrc:  Axillary Oral Oral  SpO2:  98% 96% 99%  Weight:    158 lb 3.2 oz (71.8 kg)  Height:        Intake/Output Summary (Last 24 hours) at 10/21/2017 0758 Last data filed at 10/21/2017 0700 Gross per 24 hour  Intake 838 ml  Output 3425 ml  Net -2587 ml   Filed Weights   10/19/17 0512 10/20/17 0320 10/21/17 0437  Weight: 161 lb 12.8 oz (73.4 kg) 161 lb 11.2 oz (73.3 kg) 158 lb 3.2 oz (71.8 kg)   Telemetry    Sinus tachycardia - Personally Reviewed  ECG    No new EKG for review - Personally Reviewed  Physical Exam   General: Well nourished female in no acute distress Pulm: Good air movement with no wheezing or crackles  CV: RRR, no murmurs, no rubs  Abdomen: Active bowel sounds, soft, non-distended Extremities: No LE edema    Labs    Chemistry Recent Labs  Lab 10/16/17 2336  10/18/17 0518 10/19/17 0521 10/20/17 0437 10/21/17 0437  NA 134*   < > 136 136 138 139  K 4.4   < > 3.8 3.7 3.5 3.2*  CL 101   < > 107 104 106 104  CO2 22   < > 17* 21* 21* 24  GLUCOSE 491*   < > 359* 271* 202* 147*  BUN 17   < > _0 CREATININE 1.41*   < > 0.70 0.74 0.71 0.70  CALCIUM 9.5   < > 8.7* 9.0 8.9 9.1  PROT 6.7  --  6.1* 6.9  --   --   ALBUMIN 3.7  --  3.1* 3.4*  --   --   AST 30  --  37 42*  --   --   ALT 16  --  22 35  --   --   ALKPHOS 81  --  80 120  --   --   BILITOT 0.9  --  0.7 1.0  --   --   GFRNONAA 39*   < > >60 >60 >60 >60  GFRAA 46*   < > >60 >60 >60 >60  ANIONGAP 11   < > 12 11 11 11   < > = values in this interval not displayed.    Hematology Recent Labs  Lab 10/19/17 0521 10/20/17 0437 10/21/17 0437  WBC 11.3* 10.2 9.4  RBC 4.89 4.32 4.40  HGB 14.1 12.5 12.9  HCT 42.7 38.6 38.3  MCV 87.3 89.4 87.0  MCH 28.8 28.9 29.3  MCHC 33.0 32.4 33.7  RDW 12.2 12.4 12.3  PLT 272 275 311   Cardiac Enzymes Recent Labs  Lab 10/16/17 2336 10/17/17 0640 10/17/17 1210  TROPONINI 4.52* 4.21* 4.26*   No results for input(s): TROPIPOC in the last 168 hours.   BNPNo results for input(s): BNP, PROBNP in the last 168 hours.   DDimer No results for input(s): DDIMER in the last 168 hours.   Radiology    No results found.  Cardiac Studies   Left Heart Cath and Coronary Angiography 10/19/17  1. Normal coronary arteries in a right dominant coronary circulation. 2. Severe LV dysfunction consistent with Takotsubo cardiomyopathy with vigorous basal contractility but otherwise severe hypo-to akinesis of the anterolateral wall,  apex and mid distal inferior walls.  Patient Profile     62 y.o. female who presented to the ED with seizure like activity and subsequently found to have an intercranial mass. While hospitalized she developed CP with an elevated troponin.   Assessment & Plan     NSTEMI Dilated Cardiomyopathy (Takotsubo) - Troponin peak at 4.52 before trending down.  - 2D echocardiogram showed an apical akinesis of the anterior, septal, inferior and apical walls consistent with Takotsubo cardiomyopathy.  EF estimated 25 to 30%. - LHC on 10/19/2017 illustrating normal coronary arteries with severe LV dysfunction.  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     Signed, Justin Helberg, MD  10/21/2017, 7:58 AM   

## 2017-10-21 NOTE — Telephone Encounter (Signed)
TOC Patient-- Please call Patient-- Patient has an appointment  With Kerin Ransom on 10-29-17.

## 2017-10-21 NOTE — Discharge Summary (Signed)
Diana [name], is a 62 y.o. female  DOB 02-Jul-1955  MRN [id number].  Admission date:  10/16/2017  Admitting Physician  Ivor Costa, MD  Discharge Date:  10/21/2017   Primary MD  Patient, No Pcp Per  Recommendations for primary care physician for things to follow:   1)Follow-up with Dr Kary Kos and Dr. Mickeal Skinner in neuro-oncology in 4 to 6 weeks as outpatient, They will coordinate follow-up MRI scans possible biopsy although possible this will represent ischemia and not neoplasm   2)You can get prescriptions from [facility] vs [facility] [place] with Pharmacy assistance cost being $4.00-$10.00.                    3) follow-up with cardiologist in 2 to 3 weeks  4) very low-salt diet and diabetic Diet advised  5) follow-up with appointment at the [facility] for Oct 28, 2017 @ 1030 with Freeman Caldron PA. Pt can get established for the [employer].  6)Your Medication cost are as follows--- for supplies from the [facility] Number for Diabetics.  " Rx for True Metrix glucose meter -[id number] " Lancets  28 gauge 599234 $2.00 " Strips 144360- " Syringes True Plus syringes 100 in a box.  Meter is free and 50 strips for $10.00 or 100 strips for $20.00  7)Rx for True Metrix glucose meter -[id number]  8) repeat basic metabolic profile/BMP test in 1 week  Admission Diagnosis  MYCARDIAL INFARCTION   Discharge Diagnosis  MYCARDIAL INFARCTION    Active Problems:   Type 2 diabetes mellitus (HCC)   ACS (acute coronary syndrome) (HCC)   Brain mass   Seizure-like activity (HCC)   Non-ST elevation (NSTEMI) myocardial infarction [facility])   Takotsubo cardiomyopathy      History reviewed. No pertinent past medical history.  Past Surgical History:  Procedure Laterality Date  . CHOLECYSTECTOMY  1997  . LEFT HEART CATH AND CORONARY ANGIOGRAPHY N/A 10/19/2017   Procedure: LEFT  HEART CATH AND CORONARY ANGIOGRAPHY;  Surgeon: Troy Sine, MD;  Location: [facility];  Service: Cardiovascular;  Laterality: N/A;       HPI  from the history and physical done on the day of admission:     Patient coming from: [facility]   Chief Complaint: seizure-like activity             HPI: Ayerim Berquist is a 62 y.o. female with no previously known medical history (hasn't seen a doctor in years) presenting transferred from [facility] hospital.  Was in her usual state of health today when during lunch developed shaking of right hand/arm and inability to control that hand/arm. Attempted to speak to husband but could not. This lasted approximately 10 minutes. Not too long afterward developed substernal chest pressure that resolved after about an hour. First time anything like this has happened. Did not lose consciousness, no loss of bowel or bladder. No history chest pain. Denies SOB or DOE. No known seizure history. Denies toxic  habits. Doesn't take any medications. Unaware she has diabetes.  ED Course: CT head, keppra, neurosurg and cardiology consults, EKG    Hospital Course:    Brief Narrative      62 y.o.femalewithno previously known medical history (hasn't seen a doctor in years) presenting transferred from  [facility] with newly diagnosed seizure and brain mass here with seizure, DM and elevated troponin, found to have non-ST elevation MI and intracranial mass. Also found a new onset diabetes. Status postcardiac cath 10/19/17 which showed normal coronaries,Severe LV dysfunction consistent with Takotsubo cardiomyopathy .  Much improved after IV diuresis, Echo revealed LVEF 25-30% in a pattern concerning for Takotsubo cardiomyopathy  Plan:- Acute coronary syndrome /non-ST elevation MI-  Troponin elevated this admission, 4.21, 4.26, 2-D echo shows apical akinesis of the anterior, septal, inferior and apical wall, EF 25-30%,- LDL=120 (5/18),  Echo revealed LVEF  25-30% in a pattern concerning for Takotsubo cardiomyopathy., Status post cardiac cath  10/19/17 , Normal coronary arteries in a right dominant coronary circulation.Severe LV dysfunction consistent with Takotsubo cardiomyopathy with vigorous basal contractility but otherwise severe hypo-to akinesis , cardiology team advised discontinuation of IV heparin and continuation of aspirin, Lipitor, Lasix, beta-blocker and lisinopril  , Repeat 2-D echo in 6 months to address recovery of LV EF   2)Brain mass,Seizure-like activity- MRI brain 10/17/17=> ? Malignancy Keppra loaded at [facility]. Nofurther seizure like activity Continue keppra, Neurosurgery consulted 10/17/17 => rec outpatient f/u with neurosurgery at [facility] with a repeat MRI in 4-6  weeks, Follow-up with Dr Kary Kos and Dr. Mickeal Skinner in neuro-oncology in 4 to 6 weeks as outpatient, They will coordinate follow-up MRI scans possible biopsy although possible this will represent ischemia and not neoplasm   3)T2DM - new diagnosis, hemoglobin A1c 11.2, Diabetic education provided, prescription for diabetic supplies and insulin given  Discharge Condition: stable  Follow UP  Follow-up Information    [place] Follow up on 10/28/2017.   Why:  Hospital Follow Up @ 10:30 am with Freeman Caldron PA. IF you need to reschedule please do so as soon as possible. Please utilize this location for medications cost $4.00-$10.00 Contact information: [place] [address] [phone]       Kary Kos, MD Follow up in 2 week(s).   Specialty:  Neurosurgery Contact information: [address] [place] [address] [phone]            Consults obtained -neurosurgery, cardiology,  Diet and Activity recommendation:  As advised  Discharge Instructions     Discharge Instructions    ([place]) Call MD:  Anytime you have any of the following  symptoms: 1) 3 pound weight gain in 24 hours or 5 pounds in 1 week 2) shortness of breath, with or without a dry hacking cough 3) swelling in the hands, feet or stomach 4) if you have to sleep on extra pillows at night in order to breathe.   Complete by:  As directed    Call MD for:  difficulty breathing, headache or visual disturbances   Complete by:  As directed    Call MD for:  persistant dizziness or light-headedness   Complete by:  As directed    Call MD for:  persistant nausea and vomiting   Complete by:  As directed    Call MD for:  redness, tenderness, or signs of infection (pain, swelling, redness, odor or green/yellow discharge around incision site)   Complete  by:  As directed    Call MD for:  severe uncontrolled pain   Complete by:  As directed    Call MD for:  temperature >100.4   Complete by:  As directed    Diet - low sodium heart healthy   Complete by:  As directed    Diabetic and  low-salt diet   Diet Carb Modified   Complete by:  As directed    Discharge instructions   Complete by:  As directed    1)Follow-up with Dr Kary Kos and Dr. Mickeal Skinner in neuro-oncology in 4 to 6 weeks as outpatient, They will coordinate follow-up MRI scans possible biopsy although possible this will represent ischemia and not neoplasm   2)You can get prescriptions from [facility] vs [facility] [place] with Pharmacy assistance cost being $4.00-$10.00.                    3) follow-up with cardiologist in 2 to 3 weeks  4) very low-salt diet advised  5) follow-up with appointment at the [facility] for Oct 28, 2017 @ 1030 with Freeman Caldron PA. Pt can get established for the [employer].  6)Your Medication cost are as follows--- for supplies from the [facility] Number for Diabetics.  " Rx for True Metrix glucose meter -[id number] " Lancets  28 gauge 540086 $2.00 " Strips 761950- " Syringes True Plus syringes 100 in a box.  Meter is free and 50  strips for $10.00 or 100 strips for $20.00  7)Rx for True Metrix glucose meter -[id number]  8) repeat basic Metabolic profile/BMP test in 1 week   Increase activity slowly   Complete by:  As directed         Discharge Medications     Allergies as of 10/21/2017   No Known Allergies     Medication List    TAKE these medications   aspirin 81 MG EC tablet Take 1 tablet (81 mg total) by mouth daily. With Food Start taking on:  10/22/2017   atorvastatin 40 MG tablet Commonly known as:  LIPITOR Take 1 tablet (40 mg total) by mouth daily at 6 PM.   blood glucose meter kit and supplies [employer] Prime or Dispense other brand based on patient and insurance preference. OR Rx for True Metrix glucose meter -[id number]  Use up to four times daily as directed. (FOR ICD-9 250.00, 250.01).   furosemide 40 MG tablet Commonly known as:  LASIX Take 1 tablet (40 mg total) by mouth 2 (two) times daily.   Insulin Glargine 100 UNIT/ML Solostar Pen Commonly known as:  LANTUS Inject 15 unit into skin each bedtime   insulin lispro 100 UNIT/ML KiwkPen Commonly known as:  HUMALOG TID with Meals, 70 -120: 0 units,121-150: 2 units,151- 200: 3 units 201-250: 5 units 251- 300: 8 units 301-350: 11 units 351- 400: 15 units   levETIRAcetam 500 MG tablet Commonly known as:  KEPPRA Take 1 tablet (500 mg total) by mouth 2 (two) times daily.   lisinopril 5 MG tablet Commonly known as:  PRINIVIL,ZESTRIL Take 0.5 tablets (2.5 mg total) by mouth daily.   metoprolol succinate 25 MG 24 hr tablet Commonly known as:  TOPROL-XL Take 0.5 tablets (12.5 mg total) by mouth daily. Start taking on:  10/22/2017   Pen Needles 3/16" 31G X 5 MM Misc 1 Units by Does not apply route 4 (four) times daily -  before meals and at bedtime. Code #  93818   Potassium Chloride ER 20 MEQ Tbcr Take 20 mEq by mouth daily. 1 tab daily by mouth       Major procedures and Radiology Reports - PLEASE review detailed and final reports for  all details, in brief -    Mr Jeri Cos Wo Contrast  Result Date: 10/17/2017 CLINICAL DATA:  Follow-up examination for intracranial mass. EXAM: MRI HEAD WITHOUT AND WITH CONTRAST TECHNIQUE: Multiplanar, multiecho pulse sequences of the brain and surrounding structures were obtained without and with intravenous contrast. CONTRAST:  37m MULTIHANCE GADOBENATE DIMEGLUMINE 529 MG/ML IV SOLN COMPARISON:  Prior CT from 10/16/2017. FINDINGS: Brain: Abnormal T2/FLAIR signal abnormality seen involving the parasagittal left frontal lobe, cingulate gyrus (series 11001, image 18). Area of involvement measures approximately 4.6 x 1.3 cm in size. There is a superimposed area of abnormal postcontrast enhancement within this region, measuring 1.5 x 1.2 x 1.3 cm (series 17001, image 45). This area postcontrast enhancement corresponds with abnormality seen on prior CT. Signal changes are intra-axial in location. No associated susceptibility artifact or intrinsic T1 shortening to suggest hemorrhage. 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 Superimposed postictal changes may be contributory as well. While subacute ischemia could conceivably have this appearance, this is less favored given the enhancement pattern in appearance on prior CT. No other focal parenchymal signal abnormality identified. Cerebral volume is normal for age. No significant cerebral white matter changes. No other evidence for acute or subacute ischemia. Gray-white matter differentiation otherwise well maintained. No encephalomalacia to suggest chronic infarction. No foci of susceptibility artifact to suggest acute or chronic intracranial hemorrhage. No other mass lesion, midline shift, or mass effect. No hydrocephalus. No extra-axial fluid collection. Major dural sinuses are grossly patent. Pituitary gland and suprasellar region normal. Midline structures intact and normal. Vascular: Major intracranial vascular flow voids are maintained. Skull and  upper cervical spine: Craniocervical junction within normal limits. Visualized upper cervical spine normal. Bone marrow signal intensity within normal limits. No scalp soft tissue abnormality. Sinuses/Orbits: Globes orbital soft tissues within normal limits. Paranasal sinuses are largely clear. No mastoid effusion. Inner ear structures normal. Other: None. IMPRESSION: 1. Abnormal signal intensity with superimposed 1.5 x 1.2 x 1.3 cm area of abnormal enhancement involving the left cingulate parasagittal left frontal lobe as above. 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 Superimposed postictal changes may be contributory as well. While subacute ischemia could conceivably have this appearance, this is less favored given the enhancement pattern in appearance on prior CT. No associated hemorrhage or mass effect. 2. Otherwise normal brain MRI. Electronically Signed   By: BJeannine BogaM.D.   On: 10/17/2017 05:26    Micro Results   No results found for this or any previous visit (from the past 240 hour(s)).     Today   Subjective    Markie Cozine today has no new complaints, feeling better after diuresis,          Patient has been seen and examined prior to discharge   Objective   Blood pressure 97/72, pulse 97, temperature 97.8 F (36.6 C), temperature source Oral, resp. rate 18, height '5\' 3"'$  (1.6 m), weight 71.8 kg (158 lb 3.2 oz), SpO2 99 %.   Intake/Output Summary (Last 24 hours) at 10/21/2017 1613 Last data filed at 10/21/2017 0800 Gross per 24 hour  Intake 960 ml  Output 2525 ml  Net -1565 ml    Exam Gen:- Awake Alert,  In no apparent distress  HEENT:- [place].AT,  No sclera icterus Neck-Supple Neck,No JVD,.  Lungs-  CTAB , good air movement CV- S1, S2 normal Abd-  +ve B.Sounds, Abd Soft, No tenderness,    Extremity/Skin:- No  edema,   good pulses Psych-affect is appropriate, oriented x3 Neuro-no new focal deficits, no tremors   Data Review   CBC w Diff:  Lab  Results  Component Value Date   WBC 9.4 10/21/2017   HGB 12.9 10/21/2017   HCT 38.3 10/21/2017   PLT 311 10/21/2017   LYMPHOPCT 16 10/16/2017   MONOPCT 7 10/16/2017   EOSPCT 0 10/16/2017   BASOPCT 1 10/16/2017    CMP:  Lab Results  Component Value Date   NA 139 10/21/2017   K 3.2 (L) 10/21/2017   CL 104 10/21/2017   CO2 24 10/21/2017   BUN 10 10/21/2017   CREATININE 0.70 10/21/2017   PROT 6.9 10/19/2017   ALBUMIN 3.4 (L) 10/19/2017   BILITOT 1.0 10/19/2017   ALKPHOS 120 10/19/2017   AST 42 (H) 10/19/2017   ALT 35 10/19/2017  .   Total Discharge time is about 33 minutes  Roxan Hockey M.D on 10/21/2017 at 4:13 PM  [facility]   Office  [phone]  Voice Recognition Viviann Spare dictation system was used to create this note, attempts have been made to correct errors. Please contact the author with questions and/or clarifications.

## 2017-10-21 NOTE — Progress Notes (Signed)
Patient ID: Diana [name], female   DOB: 06/04/1955, 62 y.o.   MRN: [id number] Upon discharge patient will need follow-up with myself and Dr. Mickeal Skinner in neuro-oncology.we will coordinate follow-up MRI scans possible biopsy although possible this will represent ischemia and not neoplasm.

## 2017-10-21 NOTE — Discharge Instructions (Signed)
1)Follow-up with Dr Kary Kos and Dr. Mickeal Skinner in neuro-oncology in 4 to 6 weeks as outpatient, They will coordinate follow-up MRI scans possible biopsy although possible this will represent ischemia and not neoplasm   2)You can get prescriptions from [facility] vs [facility] [place] with Pharmacy assistance cost being $4.00-$10.00.                    3) follow-up with cardiologist in 2 to 3 weeks  4) very low-salt diet and diabetic Diet advised  5) follow-up with appointment at the [facility] for Oct 28, 2017 @ 1030 with Freeman Caldron PA. Pt can get established for the [employer].  6)Your Medication cost are as follows--- for supplies from the [facility] Number for Diabetics.  " Rx for True Metrix glucose meter -[id number] " Lancets  28 gauge 400867 $2.00 " Strips 619509- " Syringes True Plus syringes 100 in a box.  Meter is free and 50 strips for $10.00 or 100 strips for $20.00  7)Rx for True Metrix glucose meter -[id number]  8) repeat basic metabolic profile/BMP test in 1 week

## 2017-10-21 NOTE — Telephone Encounter (Signed)
Follow Up:   Pt appointment is on 10-30-17 with Kerin Ransom.

## 2017-10-22 NOTE — Telephone Encounter (Signed)
Patient contacted regarding discharge from [facility] on 10/21/17.    Patient understands to follow up with provider Kerin Ransom PA on 10/30/17 at 2 pm at [facility].  Patient understands discharge instructions? yes  Patient understands medications and regiment? yes  Patient understands to bring all medications to this visit? yes   Patient and husband concerned with paying for visit due to not having insurance.  Spoke with billing, nothing is to expected or required to be paid at time of visit.   They are aware and verbalized understanding

## 2017-10-27 ENCOUNTER — Other Ambulatory Visit: Payer: Self-pay | Admitting: Internal Medicine

## 2017-10-27 DIAGNOSIS — G9389 Other specified disorders of brain: Secondary | ICD-10-CM

## 2017-10-27 DIAGNOSIS — R569 Unspecified convulsions: Secondary | ICD-10-CM

## 2017-10-28 ENCOUNTER — Ambulatory Visit: Payer: Self-pay | Attending: Family Medicine | Admitting: Physician Assistant

## 2017-10-28 VITALS — BP 134/71 | HR 73 | Temp 98.3°F | Resp 16 | Ht 63.0 in | Wt 149.4 lb

## 2017-10-28 DIAGNOSIS — Z7982 Long term (current) use of aspirin: Secondary | ICD-10-CM | POA: Insufficient documentation

## 2017-10-28 DIAGNOSIS — I249 Acute ischemic heart disease, unspecified: Secondary | ICD-10-CM | POA: Insufficient documentation

## 2017-10-28 DIAGNOSIS — R569 Unspecified convulsions: Secondary | ICD-10-CM | POA: Insufficient documentation

## 2017-10-28 DIAGNOSIS — E118 Type 2 diabetes mellitus with unspecified complications: Secondary | ICD-10-CM | POA: Insufficient documentation

## 2017-10-28 DIAGNOSIS — G9389 Other specified disorders of brain: Secondary | ICD-10-CM

## 2017-10-28 DIAGNOSIS — Z79899 Other long term (current) drug therapy: Secondary | ICD-10-CM | POA: Insufficient documentation

## 2017-10-28 DIAGNOSIS — Z794 Long term (current) use of insulin: Secondary | ICD-10-CM | POA: Insufficient documentation

## 2017-10-28 DIAGNOSIS — G939 Disorder of brain, unspecified: Secondary | ICD-10-CM | POA: Insufficient documentation

## 2017-10-28 DIAGNOSIS — I214 Non-ST elevation (NSTEMI) myocardial infarction: Secondary | ICD-10-CM | POA: Insufficient documentation

## 2017-10-28 LAB — GLUCOSE, POCT (MANUAL RESULT ENTRY): POC Glucose: 114 mg/dl — AB (ref 70–99)

## 2017-10-28 MED ORDER — INSULIN GLARGINE 100 UNIT/ML SOLOSTAR PEN: PEN_INJECTOR | SUBCUTANEOUS | 11 refills | Status: DC

## 2017-10-28 MED FILL — !LANTUS SOLOSTAR 100UNITS/M: 100 | 16 days supply | Qty: 3 | Fill #0

## 2017-10-28 NOTE — Progress Notes (Signed)
Patient ID: Diana [name], female   DOB: November 26, 1955, 62 y.o.   MRN: [id number]     Diana [name], is a 62 y.o. female  FTD:[id number]  [id number]:[id number]  DOB - Aug 19, 1955  Subjective:  Chief Complaint and HPI: Diana [name] is a 62 y.o. female here today to establish care and for a follow up visit after Hospitalization 5/17-5/22/2019 for MI.  She is doing well.  Brings blood sugar log and blood sugars running bt 107 to just under 200.  Has cardiology appt on Friday.  Needs to fill out orange card paper work.  Compliant with meds including SS insulin.  Has not scheduled neurology f/up yet.    Brief Narrative  62 y.o.femalewithno previously known medical history (hasn't seen a doctor in years) presenting transferred from [facility] with newly diagnosed seizure and brain mass here with seizure, DM and elevated troponin, found to have non-ST elevation MI and intracranial mass. Also found a new onset diabetes. Status postcardiac cath 10/19/17 which showed normal coronaries,Severe LV dysfunction consistent with Takotsubo cardiomyopathy.  Much improved after IV diuresis, Echo revealed LVEF 25-30% in a pattern concerning for Takotsubo cardiomyopathy  Plan:- Acute coronary syndrome /non-ST elevation MI-  Troponin elevated this admission, 4.21, 4.26, 2-D echo shows apical akinesis of the anterior, septal, inferior and apical wall, EF 25-30%,- LDL=120 (5/18),Echo revealed LVEF 25-30% in a pattern concerning for Takotsubo cardiomyopathy., Status postcardiac cath 10/19/17, Normal coronary arteries in a right dominant coronary circulation.Severe LV dysfunction consistent with Takotsubo cardiomyopathy with vigorous basal contractility but otherwise severe hypo-to akinesis , cardiology team advised discontinuation of IV heparin and continuation of aspirin, Lipitor, Lasix, beta-blocker and lisinopril  , Repeat 2-D echo in 6 months to address recovery of LV EF   2)Brain mass,Seizure-like activity- MRI brain  10/17/17=> ? Malignancy Keppra loaded at [facility]. Nofurther seizure like activity Continue keppra, Neurosurgery consulted 10/17/17 =>rec outpatient f/u with neurosurgery at [facility] with a repeat MRI in 4-6 weeks, Follow-up with Dr Kary Kos and Dr. Mickeal Skinner in neuro-oncology in 4 to 6 weeks as outpatient, They will coordinate follow-up MRI scans possible biopsy although possible this will represent ischemia and not neoplasm   3)T2DM - new diagnosis, hemoglobin A1c 11.2, Diabetic education provided, prescription for diabetic supplies and insulin given     Discharge Diagnosis  MYCARDIAL INFARCTION    Active Problems:   Type 2 diabetes mellitus ([place])   ACS (acute coronary syndrome) ([place])   Brain mass   Seizure-like activity ([place])   Non-ST elevation (NSTEMI) myocardial infarction [facility])   Takotsubo cardiomyopathy    ED/Hospital notes reviewed.   Social History: Family history:  ROS:   Constitutional:  No f/c, No night sweats, No unexplained weight loss. EENT:  No vision changes, No blurry vision, No hearing changes. No mouth, throat, or ear problems.  Respiratory: No cough, No SOB Cardiac: No CP, no palpitations GI:  No abd pain, No N/V/D. GU: No Urinary s/sx Musculoskeletal: No joint pain Neuro: No headache, no dizziness, no motor weakness.  Skin: No rash Endocrine:  No polydipsia. No polyuria.  Psych: Denies SI/HI  No problems updated.  ALLERGIES: No Known Allergies  PAST MEDICAL HISTORY: History reviewed. No pertinent past medical history.  MEDICATIONS AT HOME: Prior to Admission medications   Medication Sig Start Date End Date Taking? Authorizing Provider  aspirin 81 MG EC tablet Take 1 tablet (81 mg total) by mouth daily. With Food 10/22/17  Yes Emokpae, Courage, MD  atorvastatin (LIPITOR) 40 MG tablet Take 1  tablet (40 mg total) by mouth daily at 6 PM. 10/21/17  Yes Emokpae, Courage, MD  blood glucose meter kit and supplies Relion Prime or Dispense other  brand based on patient and insurance preference. OR Rx for True Metrix glucose meter -[id number]  Use up to four times daily as directed. (FOR ICD-9 250.00, 250.01). 10/21/17  Yes Emokpae, Courage, MD  furosemide (LASIX) 40 MG tablet Take 1 tablet (40 mg total) by mouth 2 (two) times daily. 10/21/17 10/21/18 Yes Roxan Hockey, MD  Insulin Glargine (LANTUS) 100 UNIT/ML Solostar Pen Inject 18 unit into skin each bedtime 10/28/17  Yes McClung, Angela M, PA-C  insulin lispro (HUMALOG) 100 UNIT/ML KiwkPen TID with Meals, 70 -120: 0 units,121-150: 2 units,151- 200: 3 units 201-250: 5 units 251- 300: 8 units 301-350: 11 units 351- 400: 15 units 10/21/17  Yes Emokpae, Courage, MD  Insulin Pen Needle (PEN NEEDLES 3/16") 31G X 5 MM MISC 1 Units by Does not apply route 4 (four) times daily -  before meals and at bedtime. Code # [id number] 10/21/17  Yes Roxan Hockey, MD  levETIRAcetam (KEPPRA) 500 MG tablet Take 1 tablet (500 mg total) by mouth 2 (two) times daily. 10/21/17  Yes Emokpae, Courage, MD  lisinopril (PRINIVIL,ZESTRIL) 5 MG tablet Take 0.5 tablets (2.5 mg total) by mouth daily. 10/21/17 10/21/18 Yes Emokpae, Courage, MD  metoprolol succinate (TOPROL-XL) 25 MG 24 hr tablet Take 0.5 tablets (12.5 mg total) by mouth daily. 10/22/17  Yes Emokpae, Courage, MD  Potassium Chloride ER 20 MEQ TBCR Take 20 mEq by mouth daily. 1 tab daily by mouth 10/21/17  Yes Emokpae, Courage, MD     Objective:  EXAM:   Vitals:   10/28/17 1053  BP: 134/71  Pulse: 73  Resp: 16  Temp: 98.3 F (36.8 C)  TempSrc: Oral  SpO2: 100%  Weight: 149 lb 6.4 oz (67.8 kg)  Height: _0  (1.6 m)    General appearance : A&OX3. NAD. Non-toxic-appearing HEENT: Atraumatic and Normocephalic.  PERRLA. EOM intact.   Neck: supple, no JVD. No cervical lymphadenopathy. No thyromegaly Chest/Lungs:  Breathing-non-labored, Good air entry bilaterally, breath sounds normal without rales, rhonchi, or wheezing  CVS: S1 S2 regular, no murmurs, gallops,  rubs  Extremities: Bilateral Lower Ext shows no edema, both legs are warm to touch with = pulse throughout Neurology:  CN II-XII grossly intact, Non focal.   Psych:  TP linear. J/I WNL. Normal speech. Appropriate eye contact and affect.  Skin:  No Rash  Data Review Lab Results  Component Value Date   HGBA1C 11.2 (H) 10/16/2017     Assessment & Plan   1. Type 2 diabetes mellitus with complication, unspecified whether long term insulin use (HCC) Check blood sugars fasting and before meals and record and bring to your next visit. Not controlled but much improved-increase dose of Lantus to 18 units at bedtime - Glucose (CBG) - Basic metabolic panel -continue SS.  2. ACS (acute coronary syndrome) [facility]) Sees cardiology on 5/31  3. Non-ST elevation (NSTEMI) myocardial infarction [facility]) Sees cardiology on 5/31  4. Brain mass - Ambulatory referral to Neurology  5. Seizure-like activity ([place]) - Ambulatory referral to Neurology     Patient have been counseled extensively about nutrition and exercise  Return in about 3 weeks (around 11/18/2017) for with [facility] for DM management and 6 weeks to assign PCP/establish care.  The patient was given clear instructions to go to ER or return to medical center if symptoms don't improve, worsen  or new problems develop. The patient verbalized understanding. The patient was told to call to get lab results if they haven't heard anything in the next week.     Freeman Caldron, PA-C [facility] and [facility], [place]   10/28/2017, 11:26 AM

## 2017-10-28 NOTE — Patient Instructions (Signed)
Check blood sugars fasting and before meals and record and bring to your next visit.

## 2017-10-29 ENCOUNTER — Other Ambulatory Visit: Payer: Self-pay | Admitting: *Deleted

## 2017-10-29 DIAGNOSIS — G9389 Other specified disorders of brain: Secondary | ICD-10-CM

## 2017-10-29 LAB — BASIC METABOLIC PANEL
BUN/Creatinine Ratio: 21 (ref 12–28)
BUN: 15 mg/dL (ref 8–27)
CALCIUM: 10.1 mg/dL (ref 8.7–10.3)
CO2: 26 mmol/L (ref 20–29)
CREATININE: 0.7 mg/dL (ref 0.57–1.00)
Chloride: 97 mmol/L (ref 96–106)
GFR calc Af Amer: 108 mL/min/{1.73_m2} (ref >59)
GFR calc non Af Amer: 94 mL/min/{1.73_m2} (ref >59)
Glucose: 117 mg/dL — ABNORMAL HIGH (ref 65–99)
Potassium: 4.2 mmol/L (ref 3.5–5.2)
SODIUM: 140 mmol/L (ref 134–144)

## 2017-10-30 ENCOUNTER — Encounter: Payer: Self-pay | Admitting: Cardiology

## 2017-10-30 ENCOUNTER — Ambulatory Visit (INDEPENDENT_AMBULATORY_CARE_PROVIDER_SITE_OTHER): Payer: Self-pay | Admitting: Cardiology

## 2017-10-30 ENCOUNTER — Telehealth: Payer: Self-pay

## 2017-10-30 DIAGNOSIS — I5181 Takotsubo syndrome: Secondary | ICD-10-CM

## 2017-10-30 DIAGNOSIS — G939 Disorder of brain, unspecified: Secondary | ICD-10-CM

## 2017-10-30 DIAGNOSIS — E118 Type 2 diabetes mellitus with unspecified complications: Secondary | ICD-10-CM

## 2017-10-30 DIAGNOSIS — G9389 Other specified disorders of brain: Secondary | ICD-10-CM

## 2017-10-30 DIAGNOSIS — Z794 Long term (current) use of insulin: Secondary | ICD-10-CM

## 2017-10-30 DIAGNOSIS — I214 Non-ST elevation (NSTEMI) myocardial infarction: Secondary | ICD-10-CM

## 2017-10-30 MED ORDER — FUROSEMIDE 40 MG PO TABS: 40.0000 mg | ORAL_TABLET | Freq: Every day | ORAL | 2 refills | Status: DC

## 2017-10-30 MED ORDER — SACUBITRIL-VALSARTAN 24-26 MG PO TABS: 1.0000 | ORAL_TABLET | Freq: Two times a day (BID) | ORAL | 3 refills | Status: DC

## 2017-10-30 NOTE — Assessment & Plan Note (Signed)
BS uncontrolled on admission

## 2017-10-30 NOTE — Assessment & Plan Note (Signed)
Work up pending

## 2017-10-30 NOTE — Assessment & Plan Note (Addendum)
Troponin peak 4.5-  10/16/17 Normal coronaries at cath

## 2017-10-30 NOTE — Assessment & Plan Note (Signed)
EF 20-25% with apical AK

## 2017-10-30 NOTE — Patient Instructions (Signed)
Medication Instructions:  DECREASE furosemide (Lasix) to 40 mg (1 tablet) once daily  STOP lisinopril  Monday-Start Entresto 24-26 mg (1 tablet) two times daily   Follow-Up: 4 weeks with Dr. Radford Pax or APP at [facility]     If you need a refill on your cardiac medications before your next appointment, please call your pharmacy.

## 2017-10-30 NOTE — Telephone Encounter (Signed)
CMA spoke to patient to inform on lab results.  Patient understood. Patient verified DOB.  

## 2017-10-30 NOTE — Progress Notes (Signed)
10/30/2017 Diana [name]   [birth year]  [id number]  Primary Physician Patient, No Pcp Per Primary Cardiologist: Dr Radford Pax  HPI:  62 y.o.femalewithno previously known medical history (hasn't seen a doctor in years) presented as a transfer from [facility] 10/16/17 after she presented there with a seizure and was found to have a brain mass, uncontrolled new onset DM, and elevated troponin. Echo showed her EF to be 25% with anterior apical AK.  Cath 10/19/17 showed normal coronaries and severe LV dysfunction consistent with Takotsubo cardiomyopathy. The pt was placed on medications, limited somewhat secondary to low B/P. She was evaluated by oncology (Dr Mickeal Skinner) and neurosurgery (Dr Saintclair Halsted). Plans are in place for OP follow up. She is seeing me today as a TOC f/u. Her husband accompanied her. She denies chest pain or dyspnea. She initially had some LE edema but this has resolved.    Current Outpatient Medications  Medication Sig Dispense Refill  . aspirin 81 MG EC tablet Take 1 tablet (81 mg total) by mouth daily. With Food 30 tablet 1  . atorvastatin (LIPITOR) 40 MG tablet Take 1 tablet (40 mg total) by mouth daily at 6 PM. 30 tablet 2  . blood glucose meter kit and supplies Relion Prime or Dispense other brand based on patient and insurance preference. OR Rx for True Metrix glucose meter -[id number]  Use up to four times daily as directed. (FOR ICD-9 250.00, 250.01). 1 each 3  . furosemide (LASIX) 40 MG tablet Take 1 tablet (40 mg total) by mouth daily. 60 tablet 2  . Insulin Glargine (LANTUS) 100 UNIT/ML Solostar Pen Inject 18 unit into skin each bedtime 3 mL 11  . insulin lispro (HUMALOG) 100 UNIT/ML KiwkPen TID with Meals, 70 -120: 0 units,121-150: 2 units,151- 200: 3 units 201-250: 5 units 251- 300: 8 units 301-350: 11 units 351- 400: 15 units 3 mL 3  . Insulin Pen Needle (PEN NEEDLES 3/16") 31G X 5 MM MISC 1 Units by Does not apply route 4 (four) times daily -  before meals and at bedtime.  Code # [id number] 100 each 1  . levETIRAcetam (KEPPRA) 500 MG tablet Take 1 tablet (500 mg total) by mouth 2 (two) times daily. 60 tablet 2  . metoprolol succinate (TOPROL-XL) 25 MG 24 hr tablet Take 0.5 tablets (12.5 mg total) by mouth daily. 30 tablet 2  . Potassium Chloride ER 20 MEQ TBCR Take 20 mEq by mouth daily. 1 tab daily by mouth 30 tablet 1  . sacubitril-valsartan (ENTRESTO) 24-26 MG Take 1 tablet by mouth 2 (two) times daily. 60 tablet 3   No current facility-administered medications for this visit.     No Known Allergies  No past medical history on file.  Social History   Socioeconomic History  . Marital status: Married    Spouse name: Not on file  . Number of children: Not on file  . Years of education: Not on file  . Highest education level: Not on file  Occupational History  . Not on file  Social Needs  . Financial resource strain: Not on file  . Food insecurity:    Worry: Not on file    Inability: Not on file  . Transportation needs:    Medical: Not on file    Non-medical: Not on file  Tobacco Use  . Smoking status: Never Smoker  . Smokeless tobacco: Never Used  Substance and Sexual Activity  . Alcohol use: Never    Frequency: Never  .  Drug use: Never  . Sexual activity: Yes  Lifestyle  . Physical activity:    Days per week: Not on file    Minutes per session: Not on file  . Stress: Not on file  Relationships  . Social connections:    Talks on phone: Not on file    Gets together: Not on file    Attends religious service: Not on file    Active member of club or organization: Not on file    Attends meetings of clubs or organizations: Not on file    Relationship status: Not on file  . Intimate partner violence:    Fear of current or ex partner: Not on file    Emotionally abused: Not on file    Physically abused: Not on file    Forced sexual activity: Not on file  Other Topics Concern  . Not on file  Social History Narrative  . Not on file     No  family history on file.   Review of Systems: General: negative for chills, fever, night sweats or weight changes.  Cardiovascular: negative for chest pain, dyspnea on exertion, edema, orthopnea, palpitations, paroxysmal nocturnal dyspnea or shortness of breath Dermatological: negative for rash Respiratory: negative for cough or wheezing Urologic: negative for hematuria Abdominal: negative for nausea, vomiting, diarrhea, bright red blood per rectum, melena, or hematemesis Neurologic: negative for visual changes, syncope, or dizziness All other systems reviewed and are otherwise negative except as noted above.    Blood pressure 112/60, pulse 75, height _0  (1.6 m), weight 149 lb (67.6 kg).  General appearance: alert, cooperative and no distress Neck: no carotid bruit and no JVD Lungs: clear to auscultation bilaterally Heart: regular rate and rhythm Extremities: no edema Skin: Skin color, texture, turgor normal. 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 Decrease Lasix to 40 mg daily. F/U one month to adjust Entresto and set up 3 month f/u echo.   Kerin Ransom PA-C 10/30/2017 2:24 PM

## 2017-10-30 NOTE — Telephone Encounter (Signed)
-----   Message from Argentina Donovan, [place] sent at 10/29/2017  8:21 AM EDT ----- Please call patient.  Her labs have improved since hospitalization.  Follow-up as planned. Thanks, Freeman Caldron, PA-C

## 2017-11-17 NOTE — Progress Notes (Signed)
S:   Pt is a 62 yo female recently diagnosed with diabetes who presents for education and management at the request of Freeman Caldron. Patient was referred on 10/28/17.  Patient has appointment to establish care here with [facility] on 12/09/17.  Patient arrives in good spirits.    Patient reports diabetes was diagnosed recently during hospitalization.   Family/Social History:  - no pertinent family history listed - never smoker  Human resources officer affordability:  - patient does not have insurance. She is in the process of enrolling for PASS for Lantus.   Patient reports adherence with medications.  Current diabetes medications include:  - Lantus 18 units into the skin daily - Lispro sliding scale 0-15 units TID w/ meals. Reports not using but 2-3 units when requiring sliding scale.   Patient denies hypoglycemic events.  Patient reported dietary habits: Eats 3 meals/day Pt did not go into specifics concerning diet. She does state that she sometimes struggles with white carbs (bread, pasta, cake).   Patient reported exercise habits:  - Pt reports moving around and getting some physical activity. However, she does have some baseline neuropathy that impacts exercise tolerance.    Patient reports nocturia. This is baseline. Has not progressed since seeing Levada Dy.  Patient reports neuropathy. This is baseline. However, she did state that this has moved from her legs to her toes.  Patient reports visual changes. She states blurred vision has improved since hospitalization  O:  Physical Exam   ROS  Lab Results  Component Value Date   HGBA1C 11.2 (H) 10/16/2017   There were no vitals filed for this visit.  POCT glucose: 144. Of note, patient states eating breakfast at 7:00 AM.  Home fasting CBG: 125 - 158 (Goal 80-130) 2 hour pre-prandial/random CBG: 121 - 179 (Goal < 180)  ASCVD risk: Pt has history positive of ASCVD with non-ST elevated MI.   A/P: Diabetes newly  diagnosed currently uncontrolled based on A1c.of 11.2. Patient is able to verbalize appropriate hypoglycemia management plan. Patient is adherent with medication. Control is suboptimal due to some dietary indiscretion.  Spent over 30 minutes discussing diabetes, treatment options, lifestyle modifications, and insulin pearls with patient. She and her husband are extremely motivated to control patient's diabetes. Current home levels mostly at goal. Discussed with patient her fasting and post-prandial/random goals.Of note, she reports only requiring 2-3 units of RA insulin before meals and states that she does not want to take several injections a day. All random/prandial levels have been <180. Will discontinue meal-time insulin for now. Anticipate that sugars may gradually increase. However, because of patient's desire to use 1 injection vs 4 a day, will try to get fasting levels at goal with Lantus titration before considering additional agent.  Could consider GLP-1 RA in this patient d/t cardiac history and benefit offered from this class if prandial/random glucoses return elevated. I would recommend this before bringing back bolus insulin. Again, mainly d/t the fact that patient is requiring little to none of her sliding scale and because of her cardiac history/clinical ASCVD. Additionally, pt reports no pancreatitis history or history of thyroid cancer.   -Increased dose of Lantus from 18 to 20 units daily.  -Stop sliding scale insulin for now.  - Check fasting sugars and alternate post-prandials daily. -Extensively discussed pathophysiology of DM, recommended lifestyle interventions, dietary effects on glycemic control -Counseled on s/sx of and management of hypoglycemia -Next A1C anticipated 12/2017.    ASCVD risk - Pt has history of non-ST  elevated MI. Last LDL is not at goal. High intensity statin indicated. Aspirin is indicated.  -Continued aspirin 81 mg  -Continued Atorvastatin 40 mg.    Written patient instructions provided.  Total time in face to face counseling 30 minutes.   Follow up PCP Clinic Visit in 12/09/17.     Patient seen with Jonette Eva, PharmD Candidate  [employer]  Benard Halsted, PharmD, [place] [phone]

## 2017-11-18 ENCOUNTER — Ambulatory Visit: Payer: Self-pay | Attending: Family Medicine | Admitting: Pharmacist

## 2017-11-18 ENCOUNTER — Encounter: Payer: Self-pay | Admitting: Pharmacist

## 2017-11-18 DIAGNOSIS — E1140 Type 2 diabetes mellitus with diabetic neuropathy, unspecified: Secondary | ICD-10-CM | POA: Insufficient documentation

## 2017-11-18 DIAGNOSIS — E1165 Type 2 diabetes mellitus with hyperglycemia: Secondary | ICD-10-CM | POA: Insufficient documentation

## 2017-11-18 DIAGNOSIS — I252 Old myocardial infarction: Secondary | ICD-10-CM | POA: Insufficient documentation

## 2017-11-18 DIAGNOSIS — E118 Type 2 diabetes mellitus with unspecified complications: Secondary | ICD-10-CM | POA: Insufficient documentation

## 2017-11-18 LAB — GLUCOSE, POCT (MANUAL RESULT ENTRY): POC GLUCOSE: 144 mg/dL — AB (ref 70–99)

## 2017-11-18 MED ORDER — INSULIN GLARGINE 100 UNIT/ML SOLOSTAR PEN: PEN_INJECTOR | SUBCUTANEOUS | 3 refills | Status: DC

## 2017-11-18 MED FILL — METOPROLOL SUCCINATE ER 25: 25 | 30 days supply | Qty: 15 | Fill #1

## 2017-11-18 MED FILL — TRUE METRIX TEST STRIP: 25 days supply | Qty: 100 | Fill #1

## 2017-11-18 MED FILL — TRUEPLUS PEN NDL 31GX3/16": 31G X 5 MM | 25 days supply | Qty: 100 | Fill #0

## 2017-11-18 MED FILL — TRUEPLUS PEN NDL 31GX3/16: 31G X 5 MM | 25 days supply | Qty: 100 | Fill #0

## 2017-11-18 MED FILL — !LANTUS SOLOSTAR 100UNITS/M: 100 | 30 days supply | Qty: 6 | Fill #0

## 2017-11-18 MED FILL — POTASSIUM CL ER 20 MEQ TAB: 20 | 30 days supply | Qty: 30 | Fill #1

## 2017-11-18 MED FILL — levETIRAcetam 500 MG TABS: 500 | 30 days supply | Qty: 60 | Fill #1

## 2017-11-18 MED FILL — TRUEplus LANCETS 28G MISC: 25 days supply | Qty: 100 | Fill #1

## 2017-11-18 NOTE — Patient Instructions (Addendum)
Thank you for coming to see me today. Please do the following:  1. Increase Lantus to 20 units at 10-11 PM. If you have any questions or if you believe something has occurred because of this change, call me or your doctor to let one of [place] know.  2. Continue checking blood sugars at home like we talked about today. If you get readings above 500 or lower than 70, call me or the clinic to let your doctor know. See below on how to treat low blood sugar.  3. Continue making the lifestyle changes we've discussed together during our visit. Diet and exercise play a significant role in improving your blood sugars.  4. Follow-up with PCP 12/09/17.    Hypoglycemia or low blood sugar:   Low blood sugar can happen quickly and may become an emergency if not treated right away.   While this shouldn't happen often, it can be brought upon if you skip a meal or do not eat enough. Also, if your insulin or other diabetes medications are dosed too high, this can cause your blood sugar to go to low.   Warning signs of low blood sugar include: 1. Feeling shaky or dizzy 2. Feeling weak or tired  3. Excessive hunger 4. Feeling anxious or upset  5. Sweating even when you aren't exercising  What to do if I experience low blood sugar? 1. Check your blood sugar with your meter. If lower than 70, proceed to step 2.  2. Treat with 3-4 glucose tablets or 3 packets of regular sugar. If these aren't around, you can try hard candy. Yet another option would be to drink 4 ounces of fruit juice or 6 ounces of REGULAR soda.  3. Re-check your sugar in 15 minutes. If it is still below 70, do what you did in step 2 again. If has come back up, go ahead and eat a snack or small meal at this time.

## 2017-11-25 ENCOUNTER — Ambulatory Visit (INDEPENDENT_AMBULATORY_CARE_PROVIDER_SITE_OTHER): Payer: Self-pay | Admitting: Cardiology

## 2017-11-25 ENCOUNTER — Telehealth: Payer: Self-pay | Admitting: Cardiology

## 2017-11-25 ENCOUNTER — Other Ambulatory Visit: Payer: Self-pay | Admitting: *Deleted

## 2017-11-25 ENCOUNTER — Encounter (INDEPENDENT_AMBULATORY_CARE_PROVIDER_SITE_OTHER): Payer: Self-pay

## 2017-11-25 ENCOUNTER — Encounter: Payer: Self-pay | Admitting: Cardiology

## 2017-11-25 VITALS — BP 126/73 | HR 79 | Ht 63.0 in | Wt 149.0 lb

## 2017-11-25 DIAGNOSIS — R569 Unspecified convulsions: Secondary | ICD-10-CM

## 2017-11-25 DIAGNOSIS — E118 Type 2 diabetes mellitus with unspecified complications: Secondary | ICD-10-CM

## 2017-11-25 DIAGNOSIS — I5181 Takotsubo syndrome: Secondary | ICD-10-CM

## 2017-11-25 DIAGNOSIS — G939 Disorder of brain, unspecified: Secondary | ICD-10-CM

## 2017-11-25 DIAGNOSIS — G9389 Other specified disorders of brain: Secondary | ICD-10-CM

## 2017-11-25 DIAGNOSIS — Z794 Long term (current) use of insulin: Secondary | ICD-10-CM

## 2017-11-25 MED ORDER — SACUBITRIL-VALSARTAN 24-26 MG PO TABS
1.0000 | ORAL_TABLET | Freq: Two times a day (BID) | ORAL | 5 refills | Status: DC
Start: 2017-11-25 — End: 2017-12-09

## 2017-11-25 MED FILL — FUROSEMIDE 40 MG TAB: 40 | 30 days supply | Qty: 60 | Fill #1

## 2017-11-25 MED FILL — ATORVASTATIN CALCIUM 40 MG: 40 | 30 days supply | Qty: 30 | Fill #1

## 2017-11-25 NOTE — Telephone Encounter (Signed)
Per 10/30/17 office visit with Kerin Ransom  PLAN  Stop Lisinopril- start Entresto low dose in 48 hours. Decrease Lasix to 40 mg daily. F/U one month to adjust Entresto and set up 3 month f/u echo.   Kerin Ransom PA-C 10/30/2017 2:24 PM      Per office visit from today with Dr Radford Pax She will continue Entresto 24-26 mg twice daily and Toprol-XL 12.5 mg daily.  She is also on Lasix 20 mg daily.  I am going to check a 2D echocardiogram to see if her LV function is normalized which quite frequently does after about 4 weeks.  Will go ahead and send in rx to requested pharmacy and if dose is up titrated a new rx can be sent in to replace present dose.

## 2017-11-25 NOTE — Patient Instructions (Signed)
Medication Instructions:  Your physician recommends that you continue on your current medications as directed. Please refer to the Current Medication list given to you today.  Labwork: None ordered   Testing/Procedures: Your physician has requested that you have an echocardiogram. Echocardiography is a painless test that uses sound waves to create images of your heart. It provides your doctor with information about the size and shape of your heart and how well your heart's chambers and valves are working. This procedure takes approximately one hour. There are no restrictions for this procedure.  Your physician has recommended that you wear an event monitor. Event monitors are medical devices that record the heart's electrical activity. Doctors most often [place] these monitors to diagnose arrhythmias. Arrhythmias are problems with the speed or rhythm of the heartbeat. The monitor is a small, portable device. You can wear one while you do your normal daily activities. This is usually used to diagnose what is causing palpitations/syncope (passing out).   Follow-Up: Your physician wants you to follow-up in: 6 months with Dr. Radford Pax. You will receive a reminder letter in the mail two months in advance. If you don't receive a letter, please call our office to schedule the follow-up appointment.  Any Other Special Instructions Will Be Listed Below (If Applicable).    Thank you for choosing [facility], RN  [phone]  If you need a refill on your cardiac medications before your next appointment, please call your pharmacy.

## 2017-11-25 NOTE — Progress Notes (Signed)
Cardiology Office Note:    Date:  11/25/2017   ID:  Diana [name], DOB 03-Jul-1955, MRN [id number]  PCP:  Patient, No Pcp Per  Cardiologist:  Fransico Him, MD    Referring MD: No ref. provider found   Chief Complaint  Patient presents with  . Cardiomyopathy    History of Present Illness:    Diana [name] is a 62 y.o. female with a hx of [facility] 10/16/17 after she presented there with a seizure and was found to have a brain mass, uncontrolled new onset DM, and elevated troponin. Echo showed her EF to be 25% with anterior apical AK.  Cath 10/19/17 showed normal coronaries and severe LV dysfunction consistent with Takotsubo cardiomyopathy. The pt was placed on medications, limited somewhat secondary to low B/P. She was evaluated by oncology (Dr Mickeal Skinner) and neurosurgery (Dr Saintclair Halsted).  She was seen by my extender on 10/30/2017 and from a cardiac standpoint was doing well.  Her lisinopril was stopped and she was started on Entresto low-dose and Lasix was decreased to 40 mg daily.    She is here today for followup and is doing well.  She denies any chest pain or pressure, SOB, DOE, PND, orthopnea, LE edema, dizziness, palpitations or syncope. She has had several seizures recently and thinks that her heart may be causing them.  She gets another brain MRI on Friday and is fairly certain that her brain mass is gone.  She does not think she is really having seizures and that she has tremors and her heart is causing her sx.  She is compliant with her meds and is tolerating meds with no SE.    Past Medical History:  Diagnosis Date  . ACS (acute coronary syndrome) ([place]) 10/16/2017  . Brain mass 10/16/2017  . Non-ST elevation (NSTEMI) myocardial infarction ([facility])    Troponin peak 4.5-  10/16/17 Normal coronaries at cath  . Seizure-like activity ([place]) 10/16/2017  . Type 2 diabetes mellitus with complication, with long-term current use of insulin ([place]) 10/16/2017    Past Surgical History:  Procedure Laterality  Date  . CHOLECYSTECTOMY  1997  . LEFT HEART CATH AND CORONARY ANGIOGRAPHY N/A 10/19/2017   Procedure: LEFT HEART CATH AND CORONARY ANGIOGRAPHY;  Surgeon: Troy Sine, MD;  Location: [facility];  Service: Cardiovascular;  Laterality: N/A;    Current Medications: Current Meds  Medication Sig  . aspirin 81 MG EC tablet Take 1 tablet (81 mg total) by mouth daily. With Food  . atorvastatin (LIPITOR) 40 MG tablet Take 1 tablet (40 mg total) by mouth daily at 6 PM.  . blood glucose meter kit and supplies Relion Prime or Dispense other brand based on patient and insurance preference. OR Rx for True Metrix glucose meter -[id number]  Use up to four times daily as directed. (FOR ICD-9 250.00, 250.01).  . furosemide (LASIX) 40 MG tablet Take 1 tablet (40 mg total) by mouth daily.  . insulin lispro (HUMALOG) 100 UNIT/ML KiwkPen TID with Meals, 70 -120: 0 units,121-150: 2 units,151- 200: 3 units 201-250: 5 units 251- 300: 8 units 301-350: 11 units 351- 400: 15 units  . Insulin Pen Needle (PEN NEEDLES 3/16") 31G X 5 MM MISC 1 Units by Does not apply route 4 (four) times daily -  before meals and at bedtime. Code # [id number]  . levETIRAcetam (KEPPRA) 500 MG tablet Take 1 tablet (500 mg total) by mouth 2 (two) times daily.  . metoprolol succinate (TOPROL-XL) 25 MG 24 hr  tablet Take 0.5 tablets (12.5 mg total) by mouth daily.  . Potassium Chloride ER 20 MEQ TBCR Take 20 mEq by mouth daily. 1 tab daily by mouth  . sacubitril-valsartan (ENTRESTO) 24-26 MG Take 1 tablet by mouth 2 (two) times daily.     Allergies:   Patient has no known allergies.   Social History   Socioeconomic History  . Marital status: Married    Spouse name: Not on file  . Number of children: Not on file  . Years of education: Not on file  . Highest education level: Not on file  Occupational History  . Not on file  Social Needs  . Financial resource strain: Not on file  . Food insecurity:    Worry: Not on file     Inability: Not on file  . Transportation needs:    Medical: Not on file    Non-medical: Not on file  Tobacco Use  . Smoking status: Never Smoker  . Smokeless tobacco: Never Used  Substance and Sexual Activity  . Alcohol use: Never    Frequency: Never  . Drug use: Never  . Sexual activity: Yes  Lifestyle  . Physical activity:    Days per week: Not on file    Minutes per session: Not on file  . Stress: Not on file  Relationships  . Social connections:    Talks on phone: Not on file    Gets together: Not on file    Attends religious service: Not on file    Active member of club or organization: Not on file    Attends meetings of clubs or organizations: Not on file    Relationship status: Not on file  Other Topics Concern  . Not on file  Social History Narrative  . Not on file     Family History: The patient's family history is not on file.  ROS:   Please see the history of present illness.    ROS  All other systems reviewed and negative.   EKGs/Labs/Other Studies Reviewed:    The following studies were reviewed today: echo  EKG:  EKG is not ordered today.   Recent Labs: 10/17/2017: TSH 5.100 10/19/2017: ALT 35 10/21/2017: Hemoglobin 12.9; Platelets 311 10/28/2017: BUN 15; Creatinine, Ser 0.70; Potassium 4.2; Sodium 140   Recent Lipid Panel    Component Value Date/Time   CHOL 191 10/17/2017 0640   TRIG 196 (H) 10/17/2017 0640   HDL 32 (L) 10/17/2017 0640   CHOLHDL 6.0 10/17/2017 0640   VLDL 39 10/17/2017 0640   LDLCALC 120 (H) 10/17/2017 0640    Physical Exam:    VS:  BP 126/73   Pulse 79   Ht 5' 3" (1.6 m)   Wt 149 lb (67.6 kg)   BMI 26.39 kg/m     Wt Readings from Last 3 Encounters:  11/25/17 149 lb (67.6 kg)  10/30/17 149 lb (67.6 kg)  10/28/17 149 lb 6.4 oz (67.8 kg)     GEN:  Well nourished, well developed in no acute distress HEENT: Normal NECK: No JVD; No carotid bruits LYMPHATICS: No lymphadenopathy CARDIAC: RRR, no murmurs, rubs,  gallops RESPIRATORY:  Clear to auscultation without rales, wheezing or rhonchi  ABDOMEN: Soft, non-tender, non-distended MUSCULOSKELETAL:  No edema; No deformity  SKIN: Warm and dry NEUROLOGIC:  Alert and oriented x 3 PSYCHIATRIC:  Normal affect   ASSESSMENT:    1. Takotsubo cardiomyopathy   2. Type 2 diabetes mellitus with complication, with long-term current use of  insulin ([place])   3. Brain mass    PLAN:    In order of problems listed above:  1.  Takotsubo CM secondary to stress MI.  Cardiac catheterization showed normal coronary arteries with severe LV dysfunction with EF 25% and anterior apical akinesis.  She is completely asymptomatic from a cardiac standpoint.  She was just placed on Entresto a few weeks ago which she is tolerating well.  She will continue Entresto 24-26 mg twice daily and Toprol-XL 12.5 mg daily.  She is also on Lasix 20 mg daily.  I am going to check a 2D echocardiogram to see if her LV function is normalized which quite frequently does after about 4 weeks.  2.  Type 2 diabetes mellitus with long-term insulin use -this is followed by her PCP  3.  Brain mass -followed by Dr. Saintclair Halsted with neurosurgery.   She continues to have seizures and 2 occurred while walking.  She thinks that her mass will be gone on her MRI on Friday and feels that her problems with "seizures" is related to her heart.  I will get an event monitor to rule out arrhythmias that could result in reduced CO and decreased cerebral perfusion but I told her that her seizures are likely related to her brain mass.    Medication Adjustments/Labs and Tests Ordered: Current medicines are reviewed at length with the patient today.  Concerns regarding medicines are outlined above.  No orders of the defined types were placed in this encounter.  No orders of the defined types were placed in this encounter.   Signed, Fransico Him, MD  11/25/2017 3:27 PM    [place]

## 2017-11-25 NOTE — Telephone Encounter (Signed)
°*  STAT* If patient is at the pharmacy, call can be transferred to refill team.   1. Which medications need to be refilled? (please list name of each medication and dose if known)  a new prescription for Entresto-please call now  2. Which pharmacy/location (including street and city if local pharmacy) is medication to be sent to?[facility] [phone]  3. Do they need a 30 day or 90 day supply? 60 and refills

## 2017-11-25 NOTE — Telephone Encounter (Signed)
Patient was seen today by Dr Radford Pax and is calling to request a refill on entresto. There is no mention of uptitrating her dose so okay to go ahead and refill for one year? Please advise. Thanks, MI

## 2017-11-26 MED FILL — ENTRESTO 24 MG-26 MG TABLET: 24-26 | 30 days supply | Qty: 60 | Fill #0

## 2017-11-27 ENCOUNTER — Encounter: Payer: Self-pay | Admitting: Cardiology

## 2017-11-27 ENCOUNTER — Ambulatory Visit (HOSPITAL_BASED_OUTPATIENT_CLINIC_OR_DEPARTMENT_OTHER): Payer: Self-pay

## 2017-11-27 ENCOUNTER — Ambulatory Visit (HOSPITAL_COMMUNITY)
Admission: RE | Admit: 2017-11-27 | Discharge: 2017-11-27 | Disposition: A | Discharge status: Home / Self Care | Payer: Self-pay | Source: Ambulatory Visit | Attending: Internal Medicine | Admitting: Internal Medicine

## 2017-11-27 ENCOUNTER — Encounter: Payer: Self-pay | Admitting: Pharmacy Technician

## 2017-11-27 ENCOUNTER — Other Ambulatory Visit: Payer: Self-pay

## 2017-11-27 DIAGNOSIS — I272 Pulmonary hypertension, unspecified: Secondary | ICD-10-CM | POA: Insufficient documentation

## 2017-11-27 DIAGNOSIS — G9389 Other specified disorders of brain: Secondary | ICD-10-CM

## 2017-11-27 DIAGNOSIS — I252 Old myocardial infarction: Secondary | ICD-10-CM | POA: Insufficient documentation

## 2017-11-27 DIAGNOSIS — I371 Nonrheumatic pulmonary valve insufficiency: Secondary | ICD-10-CM | POA: Insufficient documentation

## 2017-11-27 DIAGNOSIS — R569 Unspecified convulsions: Secondary | ICD-10-CM | POA: Insufficient documentation

## 2017-11-27 DIAGNOSIS — E119 Type 2 diabetes mellitus without complications: Secondary | ICD-10-CM | POA: Insufficient documentation

## 2017-11-27 DIAGNOSIS — I5181 Takotsubo syndrome: Secondary | ICD-10-CM | POA: Insufficient documentation

## 2017-11-27 DIAGNOSIS — I5030 Unspecified diastolic (congestive) heart failure: Secondary | ICD-10-CM | POA: Insufficient documentation

## 2017-11-27 DIAGNOSIS — G939 Disorder of brain, unspecified: Secondary | ICD-10-CM | POA: Insufficient documentation

## 2017-11-27 DIAGNOSIS — I6521 Occlusion and stenosis of right carotid artery: Secondary | ICD-10-CM | POA: Insufficient documentation

## 2017-11-27 MED ORDER — GADOBENATE DIMEGLUMINE 529 MG/ML IV SOLN
15.0000 mL | Freq: Once | INTRAVENOUS | Status: AC | PRN
  Administered 2017-11-27: 15 mL via INTRAVENOUS

## 2017-11-27 NOTE — Progress Notes (Signed)
Our pharmacy received a prescription for [facility] for this patient.  We were able to use a free trial coupon to cover the cost of a month supply of this medication.  We are a limited pharmacy and we cannot afford the cost of subsequent refills of this medication.   I have given an application for patient assistance for this medication to the patient. The drug manufacturer will send Entresto to the patient's home up to one calendar year at no cost. She should be bringing the application to her cardiologist to complete and fax from the office.  If there are any questions about this application process, please call me at [place] at [phone].  Thanks,  [employer]

## 2017-11-30 ENCOUNTER — Telehealth: Payer: Self-pay | Admitting: Cardiology

## 2017-11-30 ENCOUNTER — Inpatient Hospital Stay: Payer: Self-pay | Attending: Internal Medicine | Admitting: Internal Medicine

## 2017-11-30 VITALS — BP 157/91 | HR 83 | Temp 98.2°F | Resp 17 | Ht 63.0 in | Wt 147.1 lb

## 2017-11-30 DIAGNOSIS — G939 Disorder of brain, unspecified: Secondary | ICD-10-CM

## 2017-11-30 DIAGNOSIS — R569 Unspecified convulsions: Secondary | ICD-10-CM

## 2017-11-30 DIAGNOSIS — G9389 Other specified disorders of brain: Secondary | ICD-10-CM

## 2017-11-30 MED ORDER — FUROSEMIDE 40 MG PO TABS: 40.0000 mg | ORAL_TABLET | ORAL | 3 refills | Status: DC | PRN

## 2017-11-30 MED ORDER — LEVETIRACETAM 750 MG PO TABS: 750.0000 mg | ORAL_TABLET | Freq: Two times a day (BID) | ORAL | 3 refills | Status: DC

## 2017-11-30 MED FILL — levETIRAcetam 750 MG TABS: 750 | 30 days supply | Qty: 60 | Fill #0

## 2017-11-30 NOTE — Telephone Encounter (Signed)
New message     I called pt to see if she had received her medicaid for July.  Pt is still self pay.  Appt for monitor has been cancelled and [employer] financial hardship papers mailed to pt to complete.  Pt want to know since her echo was normal, will she still need to wear the monitor?  If she does not get assistance, she cannot afford the monitor.

## 2017-11-30 NOTE — Telephone Encounter (Signed)
Left a detailed message informing patient okay to cancel heart monitor appt and to call back with any additional questions or concerns.

## 2017-11-30 NOTE — Telephone Encounter (Signed)
Patient was concerned that her seizures were due to her heart so I told her it was unlikely and more likely to be related to her brain mass but we could do an event monitor if it made her feel better.  At this point I do not think her seizures are related to her heart, especially with normalization of LVF.  OK to cancel heart monitor.

## 2017-11-30 NOTE — Telephone Encounter (Signed)
Notes recorded by Teressa Senter, RN on 11/30/2017 at 10:46 AM EDT Pt made aware per Dr. Radford Pax okay to decrease Lasix to PRN for swelling. She stated understanding and thankful for the call

## 2017-11-30 NOTE — Progress Notes (Signed)
[facility] [place], [place] [address] [phone]   New Patient Evaluation  Date of Service: 11/30/17 Patient Name: Nabila Albarracin Patient MRN: [id number] Patient DOB: July 19, 1955 Provider: Ventura Sellers, MD  Identifying Statement:  Norelle Runnion is a 62 y.o. female with left frontal brain mass who presents for initial consultation and evaluation.    Referring Provider: No referring provider defined for this encounter.  Biomarkers:  MGMT Unknown.  IDH 1/2 Unknown.  EGFR Unknown  TERT Unknown   History of Present Illness: The patient's records from the referring physician were obtained and reviewed and the patient interviewed to confirm this HPI.  Micaila Krus presented to medical attention in May 2019, after experiencing episode of right hand/arm twitching, followed by speech arrest.  This was characterized as a seizure and prompted a neurologic workup, uncovering a left frontal lesion consistent with either tumor or prior stroke.  Further workup demonstrated CTNI elevation and cardiomyopathy.  She had a stent placed and underwent focused cardiac monitoring.  In the interim she has been diagnosed with diabetes, and also had 2 additional stereotypical seizure events.  Biopsy was deferred until repeat MRI could be performed, which was done this past week.  Has been compliant with Keppra '500mg'$  q12.    Medications: Current Outpatient Medications on File Prior to Visit  Medication Sig Dispense Refill  . aspirin 81 MG EC tablet Take 1 tablet (81 mg total) by mouth daily. With Food 30 tablet 1  . atorvastatin (LIPITOR) 40 MG tablet Take 1 tablet (40 mg total) by mouth daily at 6 PM. 30 tablet 2  . blood glucose meter kit and supplies Relion Prime or Dispense other brand based on patient and insurance preference. OR Rx for True Metrix glucose meter -[id number]  Use up to four times daily as directed. (FOR ICD-9 250.00, 250.01). 1 each 3  .  levETIRAcetam (KEPPRA) 500 MG tablet Take 1 tablet (500 mg total) by mouth 2 (two) times daily. 60 tablet 2  . metoprolol succinate (TOPROL-XL) 25 MG 24 hr tablet Take 0.5 tablets (12.5 mg total) by mouth daily. 30 tablet 2  . Potassium Chloride ER 20 MEQ TBCR Take 20 mEq by mouth daily. 1 tab daily by mouth 30 tablet 1  . sacubitril-valsartan (ENTRESTO) 24-26 MG Take 1 tablet by mouth 2 (two) times daily. 60 tablet 5   No current facility-administered medications on file prior to visit.     Allergies: No Known Allergies Past Medical History:  Past Medical History:  Diagnosis Date  . ACS (acute coronary syndrome) ([place]) 10/16/2017  . Brain mass 10/16/2017  . Non-ST elevation (NSTEMI) myocardial infarction ([facility])    Troponin peak 4.5-  10/16/17 Normal coronaries at cath  . Pulmonary hypertension ([place])    mild with PASP 28mHg on echo 10/2017  . Se[place]-like [place] 10/16/2017  . Takotsubo cardiomyopathy    EF normalized by echo 10/2017  . Type 2 diabetes mellitus with complication, with long-term current use of insulin ([place] 10/16/2017   Past Surgical History:  Past Surgical History:  Procedure Laterality Date  . CHOLECYSTECTOMY  1997  . LEFT HEART CATH AND CORONARY ANGIOGRAPHY N/A 10/19/2017   Procedure: LEFT HEART CATH AND CORONARY ANGIOGRAPHY;  Surgeon: KTroy Sine MD;  [facility];  Service: Cardiovascular;  Laterality: N/A;   Social History:  Social History   Socioeconomic History  . Marital status: Married    Spouse name:  Not on file  . Number of children: Not on file  . Years of education: Not on file  . Highest education level: Not on file  Occupational History  . Not on file  Social Needs  . Financial resource strain: Not on file  . Food insecurity:    Worry: Not on file    Inability: Not on file  . Transportation needs:    Medical: Not on file    Non-medical: Not on file  Tobacco Use  . Smoking status: Never Smoker  . Smokeless tobacco:  Never Used  Substance and Sexual Activity  . Alcohol use: Never    Frequency: Never  . Drug use: Never  . Sexual activity: Yes  Lifestyle  . Physical activity:    Days per week: Not on file    Minutes per session: Not on file  . Stress: Not on file  Relationships  . Social connections:    Talks on phone: Not on file    Gets together: Not on file    Attends religious service: Not on file    Active member of club or organization: Not on file    Attends meetings of clubs or organizations: Not on file    Relationship status: Not on file  . Intimate partner violence:    Fear of current or ex partner: Not on file    Emotionally abused: Not on file    Physically abused: Not on file    Forced sexual activity: Not on file  Other Topics Concern  . Not on file  Social History Narrative  . Not on file   Family History: No family history on file.  Review of Systems: Constitutional: Denies fevers, chills or abnormal weight loss Eyes: Denies blurriness of vision Ears, nose, mouth, throat, and face: Denies mucositis or sore throat Respiratory: Denies cough, dyspnea or wheezes Cardiovascular: Denies palpitation, chest discomfort or lower extremity swelling Gastrointestinal:  Denies nausea, constipation, diarrhea GU: Denies dysuria or incontinence Skin: Denies abnormal skin rashes Neurological: Per HPI Musculoskeletal: Denies joint pain, back or neck discomfort. No decrease in ROM Behavioral/Psych: Denies anxiety, disturbance in thought content, and mood instability  Physical Exam: Vitals:   11/30/17 1116  BP: (!) 157/91  Pulse: 83  Resp: 17  Temp: 98.2 F (36.8 C)  SpO2: 100%   KPS: 90. General: Alert, cooperative, pleasant, in no acute distress Head: Normal EENT: No conjunctival injection or scleral icterus. Oral mucosa moist Lungs: Resp effort normal Cardiac: Regular rate and rhythm Abdomen: Soft, non-distended abdomen Skin: No rashes cyanosis or petechiae. Extremities:  No clubbing or edema  Neurologic Exam: Mental Status: Awake, alert, attentive to examiner. Oriented to self and environment. Language is fluent with intact comprehension.  Cranial Nerves: Visual acuity is grossly normal. Visual fields are full. Extra-ocular movements intact. No ptosis. Face is symmetric, tongue midline. Motor: Tone and bulk are normal. Power is full in both arms and legs. Reflexes are symmetric, no pathologic reflexes present. Intact finger to nose bilaterally Sensory: Intact to light touch and temperature Gait: Normal and tandem gait is normal.   Labs: I have reviewed the data as listed    Component Value Date/Time   NA 140 10/28/2017 1146   K 4.2 10/28/2017 1146   CL 97 10/28/2017 1146   CO2 26 10/28/2017 1146   GLUCOSE 117 (H) 10/28/2017 1146   GLUCOSE 147 (H) 10/21/2017 0437   BUN 15 10/28/2017 1146   CREATININE 0.70 10/28/2017 1146   CALCIUM 10.1 10/28/2017  1146   PROT 6.9 10/19/2017 0521   ALBUMIN 3.4 (L) 10/19/2017 0521   AST 42 (H) 10/19/2017 0521   ALT 35 10/19/2017 0521   ALKPHOS 120 10/19/2017 0521   BILITOT 1.0 10/19/2017 0521   GFRNONAA 94 10/28/2017 1146   GFRAA 108 10/28/2017 1146   Lab Results  Component Value Date   WBC 9.4 10/21/2017   NEUTROABS 11.6 (H) 10/16/2017   HGB 12.9 10/21/2017   HCT 38.3 10/21/2017   MCV 87.0 10/21/2017   PLT 311 10/21/2017    Imaging: [place] Clinician Interpretation: I have personally reviewed the CNS images as listed.  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  Neoplasm versus stroke. EXAM: MRI HEAD WITHOUT AND WITH CONTRAST MRA HEAD WITHOUT CONTRAST MRA NECK WITHOUT AND WITH CONTRAST TECHNIQUE: Multiplanar, multiecho pulse sequences of the brain and surrounding structures were obtained without and with intravenous contrast. Angiographic images of the Circle of  Willis were obtained using MRA technique without intravenous contrast. Angiographic images of the neck were obtained using MRA technique without and with intravenous contrast. Carotid stenosis measurements (when applicable) are obtained utilizing NASCET criteria, using the distal internal carotid diameter as the denominator. CONTRAST:  3m MULTIHANCE GADOBENATE DIMEGLUMINE 529 MG/ML IV SOLN COMPARISON:  10/17/2017 FINDINGS: MRI HEAD FINDINGS Brain: There is persistent abnormal infiltrative in masslike T2 hyperintensity in the parasagittal posterior left frontal lobe and cingulate gyrus which may have slightly worsened in the interim and extends over an area of 5.0 x 1.5 x 3.7 cm. A heterogeneously enhancing mass within this area of T2 signal abnormality also appears minimally larger, measuring 1.7 x 1.1 x 1.5 cm. This mass is superficially located in the parasagittal frontal lobe and there is adjacent focal dural thickening along the falx. There is mild trace diffusion signal abnormality within this region with predominantly increased ADC. Elsewhere, there is no evidence of acute infarct, intracranial hemorrhage, second mass, midline shift, or extra-axial fluid collection. The ventricles and sulci are normal, and the brain is otherwise normal in signal. Vascular: Major intracranial vascular flow voids are preserved. Skull and upper cervical spine: Unremarkable bone marrow signal. Sinuses/Orbits: Unremarkable orbits. Paranasal sinuses and mastoid air cells are clear. Other: None. MRA HEAD FINDINGS The visualized distal vertebral arteries are widely patent to the basilar with the left being mildly dominant. Patent PICA, AICA, and SCA origins are visualized bilaterally. The basilar artery is widely patent. A moderately large right posterior communicating artery is noted. Focal moderate stenosis versus artifact is questioned in the distal right P1 segment. The internal carotid arteries are patent from skull base to  carotid termini with suggestion of a potentially severe anterior genu stenosis on the right, however the degree of stenosis is likely artifactually accentuated by technical factors as the narrowing appears more moderate on the contrast-enhanced neck MRA. ACAs and MCAs are patent without evidence of proximal branch occlusion or significant stenosis. No aneurysm is identified. MRA NECK FINDINGS Motion artifact limits evaluation of the aortic arch and proximal arch vessels. There is a common origin of the brachiocephalic and left common carotid arteries, a normal variant. The carotid arteries are patent with approximately 55% proximal right ICA stenosis noted. There is mild irregularity of the proximal left ICA with less than 50% narrowing. The vertebral arteries are patent with antegrade flow bilaterally and with the left being mildly dominant. No  significant vertebral artery stenosis is identified. IMPRESSION: 1. Stable to slight worsening of masslike T2 hyperintensity in the parasagittal left frontal lobe with slight enlargement of an enhancing 1.7 cm mass, most consistent with primary CNS neoplasm. 2. Otherwise unremarkable appearance of the brain. 3. Patent circle of Willis with moderate intracranial right ICA stenosis and possible moderate right P1 stenosis. 4. 55% proximal right ICA stenosis. 5. No significant left ICA or vertebral artery stenosis. Electronically Signed   By: Logan Bores M.D.   On: 11/27/2017 14:09   Mr Jodene Nam Neck W Wo Contrast  Result Date: 11/27/2017 CLINICAL DATA:  Follow-up brain mass.  Neoplasm versus stroke. EXAM: MRI HEAD WITHOUT AND WITH CONTRAST MRA HEAD WITHOUT CONTRAST MRA NECK WITHOUT AND WITH CONTRAST TECHNIQUE: Multiplanar, multiecho pulse sequences of the brain and surrounding structures were obtained without and with intravenous contrast. Angiographic images of the Circle of Willis were obtained using MRA technique without intravenous contrast. Angiographic images of the neck  were obtained using MRA technique without and with intravenous contrast. Carotid stenosis measurements (when applicable) are obtained utilizing NASCET criteria, using the distal internal carotid diameter as the denominator. CONTRAST:  92m MULTIHANCE GADOBENATE DIMEGLUMINE 529 MG/ML IV SOLN COMPARISON:  10/17/2017 FINDINGS: MRI HEAD FINDINGS Brain: There is persistent abnormal infiltrative in masslike T2 hyperintensity in the parasagittal posterior left frontal lobe and cingulate gyrus which may have slightly worsened in the interim and extends over an area of 5.0 x 1.5 x 3.7 cm. A heterogeneously enhancing mass within this area of T2 signal abnormality also appears minimally larger, measuring 1.7 x 1.1 x 1.5 cm. This mass is superficially located in the parasagittal frontal lobe and there is adjacent focal dural thickening along the falx. There is mild trace diffusion signal abnormality within this region with predominantly increased ADC. Elsewhere, there is no evidence of acute infarct, intracranial hemorrhage, second mass, midline shift, or extra-axial fluid collection. The ventricles and sulci are normal, and the brain is otherwise normal in signal. Vascular: Major intracranial vascular flow voids are preserved. Skull and upper cervical spine: Unremarkable bone marrow signal. Sinuses/Orbits: Unremarkable orbits. Paranasal sinuses and mastoid air cells are clear. Other: None. MRA HEAD FINDINGS The visualized distal vertebral arteries are widely patent to the basilar with the left being mildly dominant. Patent PICA, AICA, and SCA origins are visualized bilaterally. The basilar artery is widely patent. A moderately large right posterior communicating artery is noted. Focal moderate stenosis versus artifact is questioned in the distal right P1 segment. The internal carotid arteries are patent from skull base to carotid termini with suggestion of a potentially severe anterior genu stenosis on the right, however the  degree of stenosis is likely artifactually accentuated by technical factors as the narrowing appears more moderate on the contrast-enhanced neck MRA. ACAs and MCAs are patent without evidence of proximal branch occlusion or significant stenosis. No aneurysm is identified. MRA NECK FINDINGS Motion artifact limits evaluation of the aortic arch and proximal arch vessels. There is a common origin of the brachiocephalic and left common carotid arteries, a normal variant. The carotid arteries are patent with approximately 55% proximal right ICA stenosis noted. There is mild irregularity of the proximal left ICA with less than 50% narrowing. The vertebral arteries are patent with antegrade flow bilaterally and with the left being mildly dominant. No significant vertebral artery stenosis is identified. IMPRESSION: 1. Stable to slight worsening of masslike T2 hyperintensity in the parasagittal left frontal lobe with slight enlargement of an enhancing 1.7 cm  mass, most consistent with primary CNS neoplasm. 2. Otherwise unremarkable appearance of the brain. 3. Patent circle of Willis with moderate intracranial right ICA stenosis and possible moderate right P1 stenosis. 4. 55% proximal right ICA stenosis. 5. No significant left ICA or vertebral artery stenosis. Electronically Signed   By: Logan Bores M.D.   On: 11/27/2017 14:09   Mr Jeri Cos PJ Contrast  Result Date: 11/27/2017 CLINICAL DATA:  Follow-up brain mass.  Neoplasm versus stroke. EXAM: MRI HEAD WITHOUT AND WITH CONTRAST MRA HEAD WITHOUT CONTRAST MRA NECK WITHOUT AND WITH CONTRAST TECHNIQUE: Multiplanar, multiecho pulse sequences of the brain and surrounding structures were obtained without and with intravenous contrast. Angiographic images of the Circle of Willis were obtained using MRA technique without intravenous contrast. Angiographic images of the neck were obtained using MRA technique without and with intravenous contrast. Carotid stenosis measurements (when  applicable) are obtained utilizing NASCET criteria, using the distal internal carotid diameter as the denominator. CONTRAST:  95m MULTIHANCE GADOBENATE DIMEGLUMINE 529 MG/ML IV SOLN COMPARISON:  10/17/2017 FINDINGS: MRI HEAD FINDINGS Brain: There is persistent abnormal infiltrative in masslike T2 hyperintensity in the parasagittal posterior left frontal lobe and cingulate gyrus which may have slightly worsened in the interim and extends over an area of 5.0 x 1.5 x 3.7 cm. A heterogeneously enhancing mass within this area of T2 signal abnormality also appears minimally larger, measuring 1.7 x 1.1 x 1.5 cm. This mass is superficially located in the parasagittal frontal lobe and there is adjacent focal dural thickening along the falx. There is mild trace diffusion signal abnormality within this region with predominantly increased ADC. Elsewhere, there is no evidence of acute infarct, intracranial hemorrhage, second mass, midline shift, or extra-axial fluid collection. The ventricles and sulci are normal, and the brain is otherwise normal in signal. Vascular: Major intracranial vascular flow voids are preserved. Skull and upper cervical spine: Unremarkable bone marrow signal. Sinuses/Orbits: Unremarkable orbits. Paranasal sinuses and mastoid air cells are clear. Other: None. MRA HEAD FINDINGS The visualized distal vertebral arteries are widely patent to the basilar with the left being mildly dominant. Patent PICA, AICA, and SCA origins are visualized bilaterally. The basilar artery is widely patent. A moderately large right posterior communicating artery is noted. Focal moderate stenosis versus artifact is questioned in the distal right P1 segment. The internal carotid arteries are patent from skull base to carotid termini with suggestion of a potentially severe anterior genu stenosis on the right, however the degree of stenosis is likely artifactually accentuated by technical factors as the narrowing appears more  moderate on the contrast-enhanced neck MRA. ACAs and MCAs are patent without evidence of proximal branch occlusion or significant stenosis. No aneurysm is identified. MRA NECK FINDINGS Motion artifact limits evaluation of the aortic arch and proximal arch vessels. There is a common origin of the brachiocephalic and left common carotid arteries, a normal variant. The carotid arteries are patent with approximately 55% proximal right ICA stenosis noted. There is mild irregularity of the proximal left ICA with less than 50% narrowing. The vertebral arteries are patent with antegrade flow bilaterally and with the left being mildly dominant. No significant vertebral artery stenosis is identified. IMPRESSION: 1. Stable to slight worsening of masslike T2 hyperintensity in the parasagittal left frontal lobe with slight enlargement of an enhancing 1.7 cm mass, most consistent with primary CNS neoplasm. 2. Otherwise unremarkable appearance of the brain. 3. Patent circle of Willis with moderate intracranial right ICA stenosis and possible moderate right P1 stenosis.  4. 55% proximal right ICA stenosis. 5. No significant left ICA or vertebral artery stenosis. Electronically Signed   By: Logan Bores M.D.   On: 11/27/2017 14:09    Pathology:  Assessment/Plan 1. Brain mass  2. Focal seizures ([place])  Ms. Lacount is clinically stable today.  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  We extensively discussed treatment options moving forward depending on tissue diagnosis.  Our recommendation at this time is for referral to Dr. Saintclair Halsted for lesion biopsy.    Because of additional seizures, Keppra should be increased to '750mg'$  q12.  She should continue to follow with cardiology regarding her cardiomyopathy and stent monitoring.   We appreciate the opportunity to participate in the care of Solstice Valek.   Screening for potential clinical trials was performed and discussed using  eligibility criteria for active protocols at [facility], loco-regional tertiary centers, as well as national database available on [email or web address].    The patient is not a candidate for a research protocol at this time due to no suitable study identified.   We spent twenty additional minutes teaching regarding the natural history, biology, and historical experience in the treatment of brain tumors. We then discussed in detail the current recommendations for therapy focusing on the mode of administration, mechanism of action, anticipated toxicities, and quality of life issues associated with this plan. We also provided teaching sheets for the patient to take home as an additional resource.  She should return to clinic after biopsy and histology is resulted.   All questions were answered. The patient knows to call the clinic with any problems, questions or concerns. No barriers to learning were detected.  The total time spent in the encounter was 60 minutes and more than 50% was on counseling and review of test results   Ventura Sellers, MD Medical Director of Neuro-Oncology [facility] at [place] 11/30/17 2:57 PM

## 2017-11-30 NOTE — Telephone Encounter (Signed)
-----   Message from Sueanne Margarita, MD sent at 11/30/2017  9:26 AM EDT ----- I am fine with Lasix PRN  Traci ----- Message ----- From: Teressa Senter, RN Sent: 11/30/2017   9:01 AM To: Sueanne Margarita, MD  Pt made aware echo results. Pt requesting if she could drop Lasix to prn. Pt denies any swelling or sob. Informed her that I will forward to Dr. Radford Pax for review and give her a call back. She stated understanding and thankful for the call

## 2017-12-01 ENCOUNTER — Telehealth: Payer: Self-pay

## 2017-12-01 NOTE — Telephone Encounter (Signed)
Per 7/1 no los 

## 2017-12-09 ENCOUNTER — Encounter: Payer: Self-pay | Admitting: Nurse Practitioner

## 2017-12-09 ENCOUNTER — Ambulatory Visit: Payer: Self-pay | Attending: Nurse Practitioner | Admitting: Nurse Practitioner

## 2017-12-09 VITALS — BP 163/73 | HR 95 | Temp 98.5°F | Ht 63.0 in | Wt 147.8 lb

## 2017-12-09 DIAGNOSIS — E1165 Type 2 diabetes mellitus with hyperglycemia: Secondary | ICD-10-CM | POA: Insufficient documentation

## 2017-12-09 DIAGNOSIS — G939 Disorder of brain, unspecified: Secondary | ICD-10-CM | POA: Insufficient documentation

## 2017-12-09 DIAGNOSIS — I10 Essential (primary) hypertension: Secondary | ICD-10-CM | POA: Insufficient documentation

## 2017-12-09 DIAGNOSIS — Z79899 Other long term (current) drug therapy: Secondary | ICD-10-CM | POA: Insufficient documentation

## 2017-12-09 DIAGNOSIS — E782 Mixed hyperlipidemia: Secondary | ICD-10-CM | POA: Insufficient documentation

## 2017-12-09 DIAGNOSIS — Z7982 Long term (current) use of aspirin: Secondary | ICD-10-CM | POA: Insufficient documentation

## 2017-12-09 DIAGNOSIS — I252 Old myocardial infarction: Secondary | ICD-10-CM | POA: Insufficient documentation

## 2017-12-09 DIAGNOSIS — R569 Unspecified convulsions: Secondary | ICD-10-CM | POA: Insufficient documentation

## 2017-12-09 DIAGNOSIS — Z794 Long term (current) use of insulin: Secondary | ICD-10-CM | POA: Insufficient documentation

## 2017-12-09 DIAGNOSIS — I272 Pulmonary hypertension, unspecified: Secondary | ICD-10-CM | POA: Insufficient documentation

## 2017-12-09 DIAGNOSIS — I5181 Takotsubo syndrome: Secondary | ICD-10-CM | POA: Insufficient documentation

## 2017-12-09 DIAGNOSIS — E118 Type 2 diabetes mellitus with unspecified complications: Secondary | ICD-10-CM | POA: Insufficient documentation

## 2017-12-09 LAB — GLUCOSE, POCT (MANUAL RESULT ENTRY): POC Glucose: 119 mg/dl — AB (ref 70–99)

## 2017-12-09 MED ORDER — TRUEPLUS LANCETS 28G MISC: 3 refills | Status: DC

## 2017-12-09 MED ORDER — METOPROLOL SUCCINATE ER 25 MG PO TB24: 25.0000 mg | ORAL_TABLET | Freq: Every day | ORAL | 1 refills | Status: DC

## 2017-12-09 MED ORDER — INSULIN GLARGINE 100 UNIT/ML SOLOSTAR PEN: 20.0000 [IU] | PEN_INJECTOR | Freq: Every day | SUBCUTANEOUS | 11 refills | Status: DC

## 2017-12-09 MED ORDER — SACUBITRIL-VALSARTAN 24-26 MG PO TABS: 1.0000 | ORAL_TABLET | Freq: Two times a day (BID) | ORAL | 5 refills | Status: DC

## 2017-12-09 MED FILL — TRUEplus LANCETS 28G MISC: 25 days supply | Qty: 100 | Fill #0

## 2017-12-09 MED FILL — METOPROLOL SUCCINATE ER 25: 25 | 30 days supply | Qty: 30 | Fill #0

## 2017-12-09 NOTE — Progress Notes (Signed)
Assessment & Plan:  Diana [name] was seen today for establish care.  Diagnoses and all orders for this visit:  Type 2 diabetes mellitus with complication, unspecified whether long term insulin use (HCC) -     Glucose (CBG) -     Insulin Glargine (LANTUS) 100 UNIT/ML Solostar Pen; Inject 20 Units into the skin daily at 10 pm. -     TRUEPLUS LANCETS 28G MISC; Use as instructed -     Microalbumin / creatinine urine ratio Continue blood sugar control as discussed in office today, low carbohydrate diet, and regular physical exercise as tolerated, 150 minutes per week (30 min each day, 5 days per week, or 50 min 3 days per week). Keep blood sugar logs with fasting goal of 80-130 mg/dl, post prandial less than 180.  For Hypoglycemia: BS <60 and Hyperglycemia BS >400; contact the clinic ASAP. Annual eye exams and foot exams are recommended.  Essential hypertension -     metoprolol succinate (TOPROL-XL) 25 MG 24 hr tablet; Take 1 tablet (25 mg total) by mouth daily. -     sacubitril-valsartan (ENTRESTO) 24-26 MG; Take 1 tablet by mouth 2 (two) times daily. -     CMP14+EGFR Continue all antihypertensives as prescribed.  Remember to bring in your blood pressure log with you for your follow up appointment.  DASH/Mediterranean Diets are healthier choices for HTN.    Mixed hyperlipidemia -     atorvastatin (LIPITOR) 40 MG tablet; Take 1 tablet (40 mg total) by mouth daily at 6 PM. INSTRUCTIONS: Work on a low fat, heart healthy diet and participate in regular aerobic exercise program by working out at least 150 minutes per week. No fried foods. No junk foods, sodas, sugary drinks, unhealthy snacking, alcohol or smoking.      Patient has been counseled on age-appropriate routine health concerns for screening and prevention. These are reviewed and up-to-date. Referrals have been placed accordingly. Immunizations are up-to-date or declined.   PATIENT DECLINES HEALTH MAINTENANCE REFERRALS AT THIS TIME. STATING  SHE WANTS TO FOCUS ON HER BRIAN TUMOR AND NOT HAVE TO WORRY IF SOMETHING ELSE IS WRONG. I HAVE STRONGLY ADVISED HER TO CONTINUE WITH HER HEALTH MAINTENANCE FOR PREVENTION AND EARLY DETECTION.  Subjective:   Chief Complaint  Patient presents with  . Establish Care    Pt. is here to establish care for diabetes and hypertension.    HPI Diana [name] 62 y.o. female presents to office today to establish care. 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 She stopped taking lasix 8 days ago for edema which has since resolved. F/U echo in the next few upcoming months per Cardiology whom she recently saw on 11-25-2017. 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 Most recent appointment 11-30-2017. It has been recommended that she have a lesion biopsy and her keppra was increased to 726m Q12 hrs due to additional noted seizures.   Hypertension She is not exercising and is adherent to low salt diet.  She does not have a blood pressure log  today.  Blood pressure is not well controlled today. Will increase metoprolol at this time.   Cardiac symptoms none. Patient denies exertional chest pressure/discomfort, lower extremity edema, near-syncope and paroxysmal nocturnal dyspnea.  Cardiovascular risk factors: diabetes mellitus, dyslipidemia and hypertension. Use of agents associated with hypertension: amphetamines.  History of target organ damage:  angina/ prior myocardial infarction.  BP Readings from Last 3 Encounters:  12/09/17 (!) 163/73  11/30/17 (!) 157/91  11/25/17 126/73    Hyperlipidemia Patient presents for follow up to hyperlipidemia.  She is medication compliant. She is diet compliant and denies skin xanthelasma or statin intolerance including myalgias.  LDL is not at goal of <70. Lab Results  Component Value Date   CHOL 191 10/17/2017   Lab Results    Component Value Date   HDL 32 (L) 10/17/2017   Lab Results  Component Value Date   LDLCALC 120 (H) 10/17/2017   Lab Results  Component Value Date   TRIG 196 (H) 10/17/2017   Lab Results  Component Value Date   CHOLHDL 6.0 10/17/2017    Diabetes Mellitus Type II Current symptoms/problems include hyperglycemia and have been improving.  Known diabetic complications: none Current diabetic medications include: Lantus (she has been taking 18 units and had been instructed to take 20 units during her visit with the pharmacist on 11-18-2017.) I instructed her to start 20units today.  Her SSI was also stopped at that time. PER PHARMACIST: WOULD CONSIDER GLB1 RA IF BLOOD GLUCOSE LEVELS NOT CONTROLLED ON LANTUS.   Eye exam current (within one year): no Weight trend: stable Prior visit with dietician: yes - PHARMACIST Current monitoring regimen: home blood tests - 2 times daily Home blood sugar records: fasting range: 100-130s Any episodes of hypoglycemia? no Is She on ACE inhibitor or angiotensin II receptor blocker?  Yes  Lab Results  Component Value Date   HGBA1C 11.2 (H) 10/16/2017   Review of Systems  Constitutional: Negative for fever, malaise/fatigue and weight loss.  HENT: Negative.  Negative for nosebleeds.   Eyes: Negative.  Negative for blurred vision, double vision and photophobia.  Respiratory: Negative.  Negative for cough and shortness of breath.   Cardiovascular: Negative.  Negative for chest pain, palpitations and leg swelling.  Gastrointestinal: Negative.  Negative for heartburn, nausea and vomiting.  Musculoskeletal: Negative.  Negative for myalgias.  Neurological: Negative.  Negative for dizziness, focal weakness, seizures and headaches.  Psychiatric/Behavioral: Negative.  Negative for suicidal ideas.    Past Medical History:  Diagnosis Date  . ACS (acute coronary syndrome) ([place]) 10/16/2017  . Brain mass 10/16/2017  . Hypertension   . Non-ST elevation (NSTEMI)  myocardial infarction (HCC)    Troponin peak 4.5-  10/16/17 Normal coronaries at cath  . Pulmonary hypertension ([place])    mild with PASP 64mHg on echo 10/2017  . Se[place] ([place] 10/16/2017  . Takotsubo cardiomyopathy    EF normalized by echo 10/2017  . Type 2 diabetes mellitus with complication, with long-term current use of insulin ([place] 10/16/2017    Past Surgical History:  Procedure Laterality Date  . CHOLECYSTECTOMY  1997  . LEFT HEART CATH AND CORONARY ANGIOGRAPHY N/A 10/19/2017   Procedure: LEFT HEART CATH AND CORONARY ANGIOGRAPHY;  Surgeon: KTroy Sine MD;  [facility];  Service: Cardiovascular;  Laterality: N/A;    Family History  Problem Relation Age of Onset  . Diabetes Father     Social History Reviewed with no changes to be made today.   Outpatient Medications Prior to Visit  Medication Sig Dispense Refill  . aspirin 81 MG EC tablet Take 1 tablet (81 mg total) by mouth daily. With Food 30 tablet 1  . blood glucose meter kit and supplies Relion Prime or Dispense other brand based on patient and insurance preference. OR Rx for True Metrix  glucose meter -[id number]  Use up to four times daily as directed. (FOR ICD-9 250.00, 250.01). 1 each 3  . furosemide (LASIX) 40 MG tablet Take 1 tablet (40 mg total) by mouth as needed for edema. 25 tablet 3  . levETIRAcetam (KEPPRA) 750 MG tablet Take 1 tablet (750 mg total) by mouth 2 (two) times daily. 60 tablet 3  . Potassium Chloride ER 20 MEQ TBCR Take 20 mEq by mouth daily. 1 tab daily by mouth 30 tablet 1  . atorvastatin (LIPITOR) 40 MG tablet Take 1 tablet (40 mg total) by mouth daily at 6 PM. 30 tablet 2  . metoprolol succinate (TOPROL-XL) 25 MG 24 hr tablet Take 0.5 tablets (12.5 mg total) by mouth daily. 30 tablet 2  . sacubitril-valsartan (ENTRESTO) 24-26 MG Take 1 tablet by mouth 2 (two) times daily. 60 tablet 5   No facility-administered medications prior to visit.     No Known Allergies      Objective:    BP (!) 163/73 (BP Location: Right Arm, Patient Position: Sitting, Cuff Size: Normal)   Pulse 95   Temp 98.5 F (36.9 C) (Oral)   Ht _0  (1.6 m)   Wt 147 lb 12.8 oz (67 kg)   SpO2 95%   BMI 26.18 kg/m  Wt Readings from Last 3 Encounters:  12/09/17 147 lb 12.8 oz (67 kg)  11/30/17 147 lb 1.6 oz (66.7 kg)  11/25/17 149 lb (67.6 kg)    Physical Exam  Constitutional: She is oriented to person, place, and time. She appears well-developed and well-nourished. She is cooperative.  HENT:  Head: Normocephalic and atraumatic.  Eyes: EOM are normal.  Neck: Normal range of motion.  Cardiovascular: Normal rate, regular rhythm, normal heart sounds and intact distal pulses. Exam reveals no gallop and no friction rub.  No murmur heard. Pulmonary/Chest: Effort normal and breath sounds normal. No tachypnea. No respiratory distress. She has no decreased breath sounds. She has no wheezes. She has no rhonchi. She has no rales. She exhibits no tenderness.  Abdominal: Bowel sounds are normal.  Musculoskeletal: Normal range of motion. She exhibits no edema.  Neurological: She is alert and oriented to person, place, and time. Coordination normal.  Skin: Skin is warm and dry.  Psychiatric: She has a normal mood and affect. Her behavior is normal. Judgment and thought content normal.  Nursing note and vitals reviewed.      Patient has been counseled extensively about nutrition and exercise as well as the importance of adherence with medications and regular follow-up. The patient was given clear instructions to go to ER or return to medical center if symptoms don't improve, worsen or new problems develop. The patient verbalized understanding.   Follow-up: Return in about 2 weeks (around 12/23/2017) for 2 week: BP recheck with NURSE and fasting lipid panel see below.   Gildardo Pounds, FNP-BC [facility], [place]   12/13/2017, 9:32  PM

## 2017-12-10 LAB — CMP14+EGFR
A/G RATIO: 2 (ref 1.2–2.2)
ALBUMIN: 4.5 g/dL (ref 3.6–4.8)
ALK PHOS: 68 IU/L (ref 39–117)
ALT: 28 IU/L (ref 0–32)
AST: 18 IU/L (ref 0–40)
BILIRUBIN TOTAL: 0.5 mg/dL (ref 0.0–1.2)
BUN / CREAT RATIO: 18 (ref 12–28)
BUN: 13 mg/dL (ref 8–27)
CHLORIDE: 105 mmol/L (ref 96–106)
CO2: 20 mmol/L (ref 20–29)
Calcium: 10 mg/dL (ref 8.7–10.3)
Creatinine, Ser: 0.72 mg/dL (ref 0.57–1.00)
GFR calc Af Amer: 104 mL/min/{1.73_m2} (ref >59)
GFR calc non Af Amer: 90 mL/min/{1.73_m2} (ref >59)
GLOBULIN, TOTAL: 2.3 g/dL (ref 1.5–4.5)
Glucose: 111 mg/dL — ABNORMAL HIGH (ref 65–99)
POTASSIUM: 4.4 mmol/L (ref 3.5–5.2)
SODIUM: 142 mmol/L (ref 134–144)
Total Protein: 6.8 g/dL (ref 6.0–8.5)

## 2017-12-10 LAB — MICROALBUMIN / CREATININE URINE RATIO
CREATININE, UR: 17.1 mg/dL
Microalb/Creat Ratio: <17.5 mg/g creat (ref 0.0–30.0)

## 2017-12-11 ENCOUNTER — Telehealth: Payer: Self-pay | Admitting: General Practice

## 2017-12-11 NOTE — Telephone Encounter (Signed)
Pt called to request her lab results conducted on 12/09/2017,please follow up when lab results have been released

## 2017-12-11 NOTE — Telephone Encounter (Signed)
Will call patient when lab results is released.

## 2017-12-13 ENCOUNTER — Encounter: Payer: Self-pay | Admitting: Nurse Practitioner

## 2017-12-13 MED ORDER — ATORVASTATIN CALCIUM 40 MG PO TABS: 40.0000 mg | ORAL_TABLET | Freq: Every day | ORAL | 2 refills | Status: DC

## 2017-12-13 NOTE — Telephone Encounter (Signed)
Lab results have been released. Thank you

## 2017-12-15 MED FILL — $LANTUS SOLOSTAR 100 UNITS/: 100 | 45 days supply | Qty: 9 | Fill #1

## 2017-12-23 ENCOUNTER — Ambulatory Visit: Payer: Self-pay | Attending: Family Medicine

## 2017-12-23 ENCOUNTER — Other Ambulatory Visit: Payer: Self-pay | Admitting: Nurse Practitioner

## 2017-12-23 ENCOUNTER — Ambulatory Visit (HOSPITAL_BASED_OUTPATIENT_CLINIC_OR_DEPARTMENT_OTHER): Payer: Self-pay | Admitting: Pharmacist

## 2017-12-23 ENCOUNTER — Encounter: Payer: Self-pay | Admitting: Pharmacist

## 2017-12-23 ENCOUNTER — Telehealth: Payer: Self-pay | Admitting: Cardiology

## 2017-12-23 VITALS — BP 169/83 | HR 80

## 2017-12-23 DIAGNOSIS — E782 Mixed hyperlipidemia: Secondary | ICD-10-CM | POA: Insufficient documentation

## 2017-12-23 DIAGNOSIS — Z79899 Other long term (current) drug therapy: Secondary | ICD-10-CM | POA: Insufficient documentation

## 2017-12-23 DIAGNOSIS — I10 Essential (primary) hypertension: Secondary | ICD-10-CM | POA: Insufficient documentation

## 2017-12-23 DIAGNOSIS — I214 Non-ST elevation (NSTEMI) myocardial infarction: Secondary | ICD-10-CM | POA: Insufficient documentation

## 2017-12-23 MED FILL — **ENTRESTO 24-26 MG TABLET: 24-26 | 14 days supply | Qty: 28 | Fill #1

## 2017-12-23 MED FILL — levETIRAcetam 750 MG TABS: 750 | 30 days supply | Qty: 60 | Fill #1

## 2017-12-23 MED FILL — TRUE METRIX TEST STRIP: 25 days supply | Qty: 100 | Fill #2

## 2017-12-23 NOTE — Telephone Encounter (Signed)
OK to stop entresto and but would like her to continue on BB

## 2017-12-23 NOTE — Telephone Encounter (Signed)
Patient was in the office to turn in paper work for [employer] - also has question regarding the meds she is on.  Want to know when she can come off the meds .

## 2017-12-23 NOTE — Progress Notes (Signed)
Patient here for lab visit only 

## 2017-12-23 NOTE — Patient Instructions (Signed)
Thank you for coming to see me today. Blood pressure was a little elevated so please take your Toprol once you eat. Try to keep a log of blood pressures at home and bring these in at your next visit with Zelda.

## 2017-12-23 NOTE — Telephone Encounter (Signed)
Spoke with patient and she agreed to stop Entresto and confirmed that she needs to continue to take Metoprolol. The patient expressed understanding and had no further questions. If problems occur she said she would call us back.

## 2017-12-23 NOTE — Telephone Encounter (Signed)
Returned call to patient. Patient is asking if she needs to continue taking her entresto and metoprolol. Patient with Hx of Takotsubo. LVF normalized on last echo 11/27/17. Patient's BP has been 120-140s/70s with HR 80s. Patient states that she is completely asymptomatic. Instructed patient to continue taking her meds for now and that Dr. Radford Pax would determine if and when she would be able to start backing off of her meds. Patient verbalized understanding.

## 2017-12-23 NOTE — Progress Notes (Signed)
   S:    PCP:  Patient arrives anxious concerning blood pressure. States that she may have white-coat hypertension. Presents to the clinic for BP re-check at the request of Zelda. Was last seen by PCP 12/09/17.  Patient reports adherence with medications.  Current BP Medications include:  -Toprol XL 25 mg daily  Antihypertensives tried in the past include:  - lisinopril 5 mg daily - Entresto 24-26 mg daily  Dietary habits include:  - Limiting salt, carbs - States that she knows the correct things to eat Exercise habits include: - Pt states that she is getting as much physical activity as she can Family / Social history:  - FH: DM (father) - Tobacco: never smoker  ASCVD: ACS/Non-ST elevated MI  Home BP readings:  SBPs 110s-120s DBPs 60-70  O:  Last 3 Office BP readings: BP Readings from Last 3 Encounters:  12/23/17 (!) 169/83  12/09/17 (!) 163/73  11/30/17 (!) 157/91    BMET    Component Value Date/Time   NA 142 12/09/2017 1217   K 4.4 12/09/2017 1217   CL 105 12/09/2017 1217   CO2 20 12/09/2017 1217   GLUCOSE 111 (H) 12/09/2017 1217   GLUCOSE 147 (H) 10/21/2017 0437   BUN 13 12/09/2017 1217   CREATININE 0.72 12/09/2017 1217   CALCIUM 10.0 12/09/2017 1217   GFRNONAA 90 12/09/2017 1217   GFRAA 104 12/09/2017 1217    Renal function: CrCl cannot be calculated (Unknown ideal weight.).  A/P: Hypertension longstanding currently uncontrolled. Of note, patient did not take Toprol XL before visit today and she states that she did not sleep last night. BP Goal <130/80 mmHg. Patient is adherent with current medications.   Pt brings in log of home pressures that appear to be at goal. She has a history of having elevated clinic pressures. She notes today that she may have "white-coat hypertension". Will continue current regimen at this time given that she did not have today's metoprolol dose. I have advised her to obtain a BP cuff so she doesn't have to check at [facility].  She agrees to keep a log of home pressures.   -Continued current antihypertensives.  -Counseled on lifestyle modifications for blood pressure control including reduced dietary sodium, increased exercise, adequate sleep  Results reviewed and written information provided.   Total time in face-to-face counseling 15 minutes.   F/U Clinic Visit in 01/20/18.   Benard Halsted, PharmD, [place] [phone]

## 2017-12-24 LAB — LIPID PANEL
CHOLESTEROL TOTAL: 125 mg/dL (ref 100–199)
Chol/HDL Ratio: 3.6 ratio (ref 0.0–4.4)
HDL: 35 mg/dL — ABNORMAL LOW (ref >39)
LDL Calculated: 69 mg/dL (ref 0–99)
Triglycerides: 106 mg/dL (ref 0–149)
VLDL Cholesterol Cal: 21 mg/dL (ref 5–40)

## 2017-12-31 ENCOUNTER — Other Ambulatory Visit: Payer: Self-pay | Admitting: Internal Medicine

## 2017-12-31 DIAGNOSIS — G9389 Other specified disorders of brain: Secondary | ICD-10-CM

## 2018-01-12 MED FILL — METOPROLOL SUCCINATE ER 25: 25 | 30 days supply | Qty: 30 | Fill #1

## 2018-01-14 ENCOUNTER — Telehealth: Payer: Self-pay | Admitting: Nurse Practitioner

## 2018-01-14 NOTE — Telephone Encounter (Signed)
Received call from patient who asks if she should continue ASA 81 mg daily. She states she wants to know if she needs to take this medication daily because she doesn't want to continue it unless necessary and she doesn't see Dr. Radford Pax until December. I advised that due to normal coronary arteries seen on cardiac cath but the hx of takotsubo, that I need to consult with Dr. Radford Pax regarding the ASA. I advised that someone from our office will call her next week with Dr. Theodosia Blender advice when she returns to the office. Patient verbalized understanding and agreement and thanked me for the call.

## 2018-01-18 NOTE — Telephone Encounter (Signed)
No ASA needed

## 2018-01-19 ENCOUNTER — Other Ambulatory Visit: Payer: Self-pay | Admitting: Family Medicine

## 2018-01-19 MED FILL — levETIRAcetam 750 MG TABS: 750 | 30 days supply | Qty: 60 | Fill #2

## 2018-01-19 MED FILL — TRUEplus LANCETS 28G MISC: 25 days supply | Qty: 100 | Fill #1

## 2018-01-19 NOTE — Telephone Encounter (Signed)
Spoke with patient about stopping aspirin. She expressed understanding and accepted.

## 2018-01-20 ENCOUNTER — Ambulatory Visit: Payer: Self-pay | Attending: Nurse Practitioner | Admitting: Nurse Practitioner

## 2018-01-20 ENCOUNTER — Encounter: Payer: Self-pay | Admitting: Nurse Practitioner

## 2018-01-20 VITALS — BP 175/137 | HR 87 | Temp 98.5°F | Ht 63.0 in | Wt 144.2 lb

## 2018-01-20 DIAGNOSIS — E1165 Type 2 diabetes mellitus with hyperglycemia: Secondary | ICD-10-CM | POA: Insufficient documentation

## 2018-01-20 DIAGNOSIS — I5181 Takotsubo syndrome: Secondary | ICD-10-CM | POA: Insufficient documentation

## 2018-01-20 DIAGNOSIS — I10 Essential (primary) hypertension: Secondary | ICD-10-CM | POA: Insufficient documentation

## 2018-01-20 DIAGNOSIS — I272 Pulmonary hypertension, unspecified: Secondary | ICD-10-CM | POA: Insufficient documentation

## 2018-01-20 DIAGNOSIS — E782 Mixed hyperlipidemia: Secondary | ICD-10-CM | POA: Insufficient documentation

## 2018-01-20 DIAGNOSIS — E118 Type 2 diabetes mellitus with unspecified complications: Secondary | ICD-10-CM | POA: Insufficient documentation

## 2018-01-20 DIAGNOSIS — Z79899 Other long term (current) drug therapy: Secondary | ICD-10-CM | POA: Insufficient documentation

## 2018-01-20 DIAGNOSIS — Z7982 Long term (current) use of aspirin: Secondary | ICD-10-CM | POA: Insufficient documentation

## 2018-01-20 DIAGNOSIS — Z794 Long term (current) use of insulin: Secondary | ICD-10-CM | POA: Insufficient documentation

## 2018-01-20 DIAGNOSIS — I252 Old myocardial infarction: Secondary | ICD-10-CM | POA: Insufficient documentation

## 2018-01-20 LAB — POCT GLYCOSYLATED HEMOGLOBIN (HGB A1C): HEMOGLOBIN A1C: 5.4 % (ref 4.0–5.6)

## 2018-01-20 LAB — GLUCOSE, POCT (MANUAL RESULT ENTRY): POC Glucose: 88 mg/dl (ref 70–99)

## 2018-01-20 MED ORDER — METOPROLOL SUCCINATE ER 25 MG PO TB24
12.5000 mg | ORAL_TABLET | Freq: Every day | ORAL | 1 refills | Status: AC
Start: 2018-01-20 — End: 2018-04-20

## 2018-01-20 MED ORDER — ATORVASTATIN CALCIUM 20 MG PO TABS: 20.0000 mg | ORAL_TABLET | Freq: Every day | ORAL | 0 refills | Status: DC

## 2018-01-20 MED ORDER — INSULIN GLARGINE 100 UNIT/ML SOLOSTAR PEN: 4.0000 [IU] | PEN_INJECTOR | Freq: Every day | SUBCUTANEOUS | 11 refills | Status: DC

## 2018-01-20 MED ORDER — INSULIN GLARGINE 100 UNIT/ML SOLOSTAR PEN: 4.0000 [IU] | PEN_INJECTOR | Freq: Every day | SUBCUTANEOUS | 2 refills | Status: DC

## 2018-01-20 MED FILL — ATORVASTATIN 20 MG TABLET: 20 | 30 days supply | Qty: 30 | Fill #0

## 2018-01-20 MED FILL — $LANTUS SOLOSTAR 100 UNITS/: 100 | 28 days supply | Qty: 3 | Fill #0

## 2018-01-20 NOTE — Patient Instructions (Signed)
Check your blood sugars every morning. If your fasting blood sugars are greater 130 consistently then increase your lantus by 2 units every 3 days until your FASTING blood sugars are 90-130.

## 2018-01-20 NOTE — Progress Notes (Signed)
Assessment & Plan:  Diana [name] was seen today for diabetes.  Diagnoses and all orders for this visit:  Type 2 diabetes mellitus with complication, unspecified whether long term insulin use (HCC) -     Glucose (CBG) -     HgB A1c -     Discontinue: Insulin Glargine (LANTUS) 100 UNIT/ML Solostar Pen; Inject 4 Units into the skin daily at 10 pm. -     Insulin Glargine (LANTUS) 100 UNIT/ML Solostar Pen; Inject 4 Units into the skin daily at 10 pm. Continue blood sugar control as discussed in office today, low carbohydrate diet, and regular physical exercise as tolerated, 150 minutes per week (30 min each day, 5 days per week, or 50 min 3 days per week). Keep blood sugar logs with fasting goal of 90-130 mg/dl, post prandial (after you eat) less than 180.  For Hypoglycemia: BS <60 and Hyperglycemia BS >400; contact the clinic ASAP. Annual eye exams and foot exams are recommended.   Essential hypertension -     metoprolol succinate (TOPROL-XL) 25 MG 24 hr tablet; Take 0.5 tablets (12.5 mg total) by mouth daily. Continue all antihypertensives as prescribed.  Remember to bring in your blood pressure log with you for your follow up appointment.  DASH/Mediterranean Diets are healthier choices for HTN.    Mixed hyperlipidemia -     atorvastatin (LIPITOR) 20 MG tablet; Take 1 tablet (20 mg total) by mouth daily at 6 PM. INSTRUCTIONS: Work on a low fat, heart healthy diet and participate in regular aerobic exercise program by working out at least 150 minutes per week; 5 days a week-30 minutes per day. Avoid red meat, fried foods. junk foods, sodas, sugary drinks, unhealthy snacking, alcohol and smoking.  Drink at least 48oz of water per day and monitor your carbohydrate intake daily.    Patient has been counseled on age-appropriate routine health concerns for screening and prevention. These are reviewed and up-to-date. Referrals have been placed accordingly. Immunizations are up-to-date or declined.      Subjective:   Chief Complaint  Patient presents with  . Diabetes    Pt. is here to have her A1C check.    HPI Diana [name] 62 y.o. female presents to office today for follow up of DM, HTN and HPL. She is accompanied by her spouse.   Essential Hypertension Blood pressure is markedly elevated today. She has a blood pressure log from home and her readings are normal 120-130/70-80s. I will not make any adjustments with her blood pressure medication at this time. She is to continue on metoprolol succinate 12.'5mg'$  daily. She currently denies chest pain, shortness of breath, palpitations, lightheadedness, dizziness, headaches or BLE edema.  BP Readings from Last 3 Encounters:  01/20/18 (!) 175/137  12/23/17 (!) 169/83  12/09/17 (!) 163/73   DM Type 2 She has made significant dietary modifications in the past 3 months and a1c has decreased from 11.2 to 5.4.  She does report hypoglycemia at times. I will decrease her lantus from 20 units to 4 units nightly.   Lab Results  Component Value Date   HGBA1C 5.4 01/20/2018   Lab Results  Component Value Date   HGBA1C 11.2 10/16/2017   Hyperlipidemia Patient presents for follow up to hyperlipidemia.  She is medication compliant. She is diet compliant and denies skin xanthelasma or statin intolerance including myalgias.  Lab Results  Component Value Date   CHOL 125 12/23/2017   Lab Results  Component Value Date   HDL 35 (  L) 12/23/2017   Lab Results  Component Value Date   LDLCALC 69 12/23/2017   Lab Results  Component Value Date   TRIG 106 12/23/2017   Lab Results  Component Value Date   CHOLHDL 3.6 12/23/2017   Review of Systems  Constitutional: Negative for fever, malaise/fatigue and weight loss.  HENT: Negative.  Negative for nosebleeds.   Eyes: Negative.  Negative for blurred vision, double vision and photophobia.  Respiratory: Negative.  Negative for cough and shortness of breath.   Cardiovascular: Negative.  Negative for chest  pain, palpitations and leg swelling.  Gastrointestinal: Negative.  Negative for heartburn, nausea and vomiting.  Musculoskeletal: Negative.  Negative for myalgias.  Neurological: Negative.  Negative for dizziness, focal weakness, seizures and headaches.  Psychiatric/Behavioral: Negative.  Negative for suicidal ideas.    Past Medical History:  Diagnosis Date  . ACS (acute coronary syndrome) ([place]) 10/16/2017  . Brain mass 10/16/2017  . Hypertension   . Non-ST elevation (NSTEMI) myocardial infarction (HCC)    Troponin peak 4.5-  10/16/17 Normal coronaries at cath  . Pulmonary hypertension ([place])    mild with PASP 56mHg on echo 10/2017  . [place] ([place] 10/16/2017  . Takotsubo cardiomyopathy    EF normalized by echo 10/2017  . Type 2 diabetes mellitus with complication, with long-term [place] use of insulin ([facility] [place] 10/16/2017    Past Surgical History:  Procedure Laterality Date  . CHOLECYSTECTOMY  1997  . LEFT HEART CATH AND CORONARY ANGIOGRAPHY N/A 10/19/2017   Procedure: LEFT HEART CATH AND CORONARY ANGIOGRAPHY;  Surgeon: KTroy Sine MD;  [facility];  Service: Cardiovascular;  Laterality: N/A;    Family History  Problem Relation Age of Onset  . Diabetes Father     Social History Reviewed with no changes to be made today.   Outpatient Medications Prior to Visit  Medication Sig Dispense Refill  . aspirin 81 MG EC tablet Take 1 tablet (81 mg total) by mouth daily. With Food 30 tablet 1  . blood glucose meter kit and supplies Relion Prime or Dispense other brand based on patient and insurance preference. OR Rx for True Metrix glucose meter -[id number]  Use up to four times daily as directed. (FOR ICD-9 250.00, 250.01). 1 each 3  . furosemide (LASIX) 40 MG tablet Take 1 tablet (40 mg total) by mouth as needed for edema. 25 tablet 3  . levETIRAcetam (KEPPRA) 750 MG tablet Take 1 tablet (750 mg total) by mouth 2 (two) times daily. 60 tablet 3  . Potassium  Chloride ER 20 MEQ TBCR Take 20 mEq by mouth daily. 1 tab daily by mouth 30 tablet 1  . TRUEPLUS LANCETS 28G MISC Use as instructed 100 each 3  . atorvastatin (LIPITOR) 40 MG tablet Take 1 tablet (40 mg total) by mouth daily at 6 PM. 30 tablet 2  . metoprolol succinate (TOPROL-XL) 25 MG 24 hr tablet Take 1 tablet (25 mg total) by mouth daily. 30 tablet 1  . Insulin Glargine (LANTUS) 100 UNIT/ML Solostar Pen Inject 20 Units into the skin daily at 10 pm. 15 mL 11   No facility-administered medications prior to visit.     No Known Allergies     Objective:    BP (!) 175/137 (BP Location: Right Arm, Patient Position: Sitting, Cuff Size: Normal)   Pulse 87   Temp 98.5 F (36.9 C) (Oral)   Ht '5\' 3"'$  (1.6 m)   Wt 144 lb 3.2 oz (65.4 kg)  SpO2 98%   BMI 25.54 kg/m  Wt Readings from Last 3 Encounters:  01/20/18 144 lb 3.2 oz (65.4 kg)  12/09/17 147 lb 12.8 oz (67 kg)  11/30/17 147 lb 1.6 oz (66.7 kg)    Physical Exam  Constitutional: She is oriented to person, place, and time. She appears well-developed and well-nourished. She is cooperative.  HENT:  Head: Normocephalic and atraumatic.  Eyes: EOM are normal.  Neck: Normal range of motion.  Cardiovascular: Normal rate, regular rhythm and normal heart sounds. Exam reveals no gallop and no friction rub.  No murmur heard. Pulmonary/Chest: Effort normal and breath sounds normal. No tachypnea. No respiratory distress. She has no decreased breath sounds. She has no wheezes. She has no rhonchi. She has no rales. She exhibits no tenderness.  Abdominal: Bowel sounds are normal.  Musculoskeletal: Normal range of motion. She exhibits no edema.  Neurological: She is alert and oriented to person, place, and time. Coordination normal.  Skin: Skin is warm and dry.  Psychiatric: She has a normal mood and affect. Her behavior is normal. Judgment and thought content normal.  Nursing note and vitals reviewed.        Patient has been counseled  extensively about nutrition and exercise as well as the importance of adherence with medications and regular follow-up. The patient was given clear instructions to go to ER or return to medical center if symptoms don't improve, worsen or new problems develop. The patient verbalized understanding.   Follow-up: Return in about 3 months (around 04/22/2018) for HTN/HPL/DM.   Gildardo Pounds, FNP-BC [facility] and [facility], [place]   01/28/2018, 7:36 PM

## 2018-01-22 ENCOUNTER — Other Ambulatory Visit: Payer: Self-pay | Admitting: Radiation Therapy

## 2018-02-16 MED FILL — TRUE METRIX TEST STRIP: 25 days supply | Qty: 100 | Fill #3

## 2018-02-16 MED FILL — !LANTUS SOLOSTAR 100UNITS/M: 100 | 28 days supply | Qty: 3 | Fill #1

## 2018-02-23 ENCOUNTER — Other Ambulatory Visit: Payer: Self-pay | Admitting: Radiation Therapy

## 2018-02-26 ENCOUNTER — Ambulatory Visit
Admission: RE | Admit: 2018-02-26 | Discharge: 2018-02-26 | Disposition: A | Discharge status: Home / Self Care | Payer: Self-pay | Source: Ambulatory Visit | Attending: Internal Medicine | Admitting: Internal Medicine

## 2018-02-26 DIAGNOSIS — G9389 Other specified disorders of brain: Secondary | ICD-10-CM

## 2018-03-01 ENCOUNTER — Inpatient Hospital Stay: Payer: Self-pay | Attending: Internal Medicine | Admitting: Internal Medicine

## 2018-03-01 ENCOUNTER — Encounter: Payer: Self-pay | Admitting: Internal Medicine

## 2018-03-01 ENCOUNTER — Other Ambulatory Visit: Payer: Self-pay

## 2018-03-01 VITALS — BP 190/95 | HR 80 | Temp 97.6°F | Resp 18 | Ht 63.0 in | Wt 134.2 lb

## 2018-03-01 DIAGNOSIS — Z79899 Other long term (current) drug therapy: Secondary | ICD-10-CM | POA: Insufficient documentation

## 2018-03-01 DIAGNOSIS — Z794 Long term (current) use of insulin: Secondary | ICD-10-CM | POA: Insufficient documentation

## 2018-03-01 DIAGNOSIS — I249 Acute ischemic heart disease, unspecified: Secondary | ICD-10-CM | POA: Insufficient documentation

## 2018-03-01 DIAGNOSIS — R9389 Abnormal findings on diagnostic imaging of other specified body structures: Secondary | ICD-10-CM | POA: Insufficient documentation

## 2018-03-01 DIAGNOSIS — E119 Type 2 diabetes mellitus without complications: Secondary | ICD-10-CM | POA: Insufficient documentation

## 2018-03-01 DIAGNOSIS — G9389 Other specified disorders of brain: Secondary | ICD-10-CM

## 2018-03-01 DIAGNOSIS — R569 Unspecified convulsions: Secondary | ICD-10-CM | POA: Insufficient documentation

## 2018-03-01 DIAGNOSIS — R001 Bradycardia, unspecified: Secondary | ICD-10-CM | POA: Insufficient documentation

## 2018-03-01 DIAGNOSIS — I10 Essential (primary) hypertension: Secondary | ICD-10-CM | POA: Insufficient documentation

## 2018-03-01 DIAGNOSIS — G939 Disorder of brain, unspecified: Secondary | ICD-10-CM | POA: Insufficient documentation

## 2018-03-01 MED ORDER — AMLODIPINE BESYLATE 5 MG PO TABS: 5.0000 mg | ORAL_TABLET | Freq: Every day | ORAL | 3 refills | Status: DC

## 2018-03-01 MED FILL — METOPROLOL SUCCINATE ER 25: 25 | 30 days supply | Qty: 15 | Fill #0

## 2018-03-01 MED FILL — levETIRAcetam 750 MG TABS: 750 | 30 days supply | Qty: 60 | Fill #3

## 2018-03-01 MED FILL — TRUEplus LANCETS 28G MISC: 25 days supply | Qty: 100 | Fill #2

## 2018-03-01 MED FILL — ATORVASTATIN 20 MG TABLET: 20 | 30 days supply | Qty: 30 | Fill #1

## 2018-03-01 NOTE — Progress Notes (Signed)
[facility] [place], [place] [address] [phone]   Interval Evaluation  Date of Service: 03/01/18 Patient Name: Diana [name] Patient MRN: [id number] Patient DOB: November 07, 1955 Provider: Ventura Sellers, MD  Identifying Statement:  Diana [name] is a 62 y.o. female with left frontal brain mass   Biomarkers:  MGMT Unknown.  IDH 1/2 Unknown.  EGFR Unknown  TERT Unknown   Interval History:  Diana [name] presents today for follow up after recent MRI brain.  She describes no new or progressive neurologic deficits.  No seizures since our last visit, continues to take Keppra '750mg'$  BID.  Takes blood pressure daily at home and says usually in 120-140 range for "top number".  PCP recently decreased metoprolol to 12.'5mg'$  BID because of bradycardia.  H+P (11/30/17): Pt presented to medical attention in May 2019, after experiencing episode of right hand/arm twitching, followed by speech arrest.  This was characterized as a seizure and prompted a neurologic workup, uncovering a left frontal lesion consistent with either tumor or prior stroke.  Further workup demonstrated CTNI elevation and cardiomyopathy.  She had a stent placed and underwent focused cardiac monitoring.  In the interim she has been diagnosed with diabetes, and also had 2 additional stereotypical seizure events.  Biopsy was deferred until repeat MRI could be performed, which was done this past week.  Has been compliant with Keppra '500mg'$  q12.    Medications: Current Outpatient Medications on File Prior to Visit  Medication Sig Dispense Refill  . atorvastatin (LIPITOR) 20 MG tablet Take 1 tablet (20 mg total) by mouth daily at 6 PM. 90 tablet 0  . Insulin Glargine (LANTUS) 100 UNIT/ML Solostar Pen Inject 4 Units into the skin daily at 10 pm. 3 mL 2  . levETIRAcetam (KEPPRA) 750 MG tablet Take 1 tablet (750 mg total) by mouth 2 (two) times daily. 60 tablet 3  . metoprolol succinate (TOPROL-XL) 25  MG 24 hr tablet Take 0.5 tablets (12.5 mg total) by mouth daily. 90 tablet 1   No current facility-administered medications on file prior to visit.     Allergies: No Known Allergies Past Medical History:  Past Medical History:  Diagnosis Date  . ACS (acute coronary syndrome) ([facility]) 10/16/2017  . Brain mass 10/16/2017  . Hypertension   . Non-ST elevation (NSTEMI) myocardial infarction (HCC)    Troponin peak 4.5-  10/16/17 Normal coronaries at cath  . Pulmonary hypertension ([place])    mild with PASP 65mHg on echo 10/2017  . Seizure-like activity ([facility] 10/16/2017  . Takotsubo cardiomyopathy    EF normalized by echo 10/2017  . Type 2 diabetes mellitus with complication, with long-term current use of insulin ([facility] 10/16/2017   Past Surgical History:  Past Surgical History:  Procedure Laterality Date  . CHOLECYSTECTOMY  1997  . LEFT HEART CATH AND CORONARY ANGIOGRAPHY N/A 10/19/2017   Procedure: LEFT HEART CATH AND CORONARY ANGIOGRAPHY;  Surgeon: KTroy Sine MD;  [facility];  Service: Cardiovascular;  Laterality: N/A;   Social History:  Social History   Socioeconomic History  . Marital status: Married    Spouse name: Not on file  . Number of children: Not on file  . Years of education: Not on file  . Highest education level: Not on file  Occupational History  . Not on file  Social Needs  . Financial resource strain: Not on file  . Food insecurity:    Worry: Not on file  Inability: Not on file  . Transportation needs:    Medical: Not on file    Non-medical: Not on file  Tobacco Use  . Smoking status: Never Smoker  . Smokeless tobacco: Never Used  Substance and Sexual Activity  . Alcohol use: Never    Frequency: Never  . Drug use: Never  . Sexual activity: Yes  Lifestyle  . Physical activity:    Days per week: Not on file    Minutes per session: Not on file  . Stress: Not on file  Relationships  . Social connections:    Talks on phone: Not on file      Gets together: Not on file    Attends religious service: Not on file    Active member of club or organization: Not on file    Attends meetings of clubs or organizations: Not on file    Relationship status: Not on file  . Intimate partner violence:    Fear of current or ex partner: Not on file    Emotionally abused: Not on file    Physically abused: Not on file    Forced sexual activity: Not on file  Other Topics Concern  . Not on file  Social History Narrative  . Not on file   Family History:  Family History  Problem Relation Age of Onset  . Diabetes Father     Review of Systems: Constitutional: Denies fevers, chills or abnormal weight loss Eyes: Denies blurriness of vision Ears, nose, mouth, throat, and face: Denies mucositis or sore throat Respiratory: Denies cough, dyspnea or wheezes Cardiovascular: Denies palpitation, chest discomfort or lower extremity swelling Gastrointestinal:  Denies nausea, constipation, diarrhea GU: Denies dysuria or incontinence Skin: Denies abnormal skin rashes Neurological: Per HPI Musculoskeletal: Denies joint pain, back or neck discomfort. No decrease in ROM Behavioral/Psych: Denies anxiety, disturbance in thought content, and mood instability  Physical Exam: Vitals:   03/01/18 1230 03/01/18 1232  BP: (!) 180/100 (!) 190/95  Pulse:    Resp:    Temp:    SpO2:     KPS: 90. General: Alert, cooperative, pleasant, in no acute distress Head: Normal EENT: No conjunctival injection or scleral icterus. Oral mucosa moist Lungs: Resp effort normal Cardiac: Regular rate and rhythm Abdomen: Soft, non-distended abdomen Skin: No rashes cyanosis or petechiae. Extremities: No clubbing or edema  Neurologic Exam: Mental Status: Awake, alert, attentive to examiner. Oriented to self and environment. Language is fluent with intact comprehension.  Cranial Nerves: Visual acuity is grossly normal. Visual fields are full. Extra-ocular movements intact.  No ptosis. Face is symmetric, tongue midline. Motor: Tone and bulk are normal. Power is full in both arms and legs. Reflexes are symmetric, no pathologic reflexes present. Intact finger to nose bilaterally Sensory: Intact to light touch and temperature Gait: Normal and tandem gait is normal.   Labs: I have reviewed the data as listed    Component Value Date/Time   NA 142 12/09/2017 1217   K 4.4 12/09/2017 1217   CL 105 12/09/2017 1217   CO2 20 12/09/2017 1217   GLUCOSE 111 (H) 12/09/2017 1217   GLUCOSE 147 (H) 10/21/2017 0437   BUN 13 12/09/2017 1217   CREATININE 0.72 12/09/2017 1217   CALCIUM 10.0 12/09/2017 1217   PROT 6.8 12/09/2017 1217   ALBUMIN 4.5 12/09/2017 1217   AST 18 12/09/2017 1217   ALT 28 12/09/2017 1217   ALKPHOS 68 12/09/2017 1217   BILITOT 0.5 12/09/2017 1217   GFRNONAA 90 12/09/2017 1217  GFRAA 104 12/09/2017 1217   Lab Results  Component Value Date   WBC 9.4 10/21/2017   NEUTROABS 11.6 (H) 10/16/2017   HGB 12.9 10/21/2017   HCT 38.3 10/21/2017   MCV 87.0 10/21/2017   PLT 311 10/21/2017    Imaging: [facility] Clinician Interpretation: I have personally reviewed the CNS images as listed.  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 Seizures. Restaging. History of "severe reaction" contrast. Current study ordered as noncontrast. EXAM: MRI HEAD WITHOUT CONTRAST TECHNIQUE: Multiplanar, multiecho pulse sequences of the brain and surrounding structures were obtained without intravenous contrast. COMPARISON:  Brain MRI without and with contrast and head and neck MRA 11/27/2017. Brain MRI 10/17/2017. Head CT 10/16/2017. FINDINGS: Brain: Increased trace diffusion signal abnormality associated with the  left medial superior frontal lesion. Semicircular area of about 3-4 centimeters now demonstrates increased diffusion with areas which appear both restricted (series 7, image 37) and isointense on ADC. Furthermore, lateral to what was the main area of the lesion previously there is new facilitated diffusion with T2 and FLAIR hyperintensity which most resembles vasogenic edema (series 7, image 34 and series 11, image 20. Meanwhile, the gyriform T2 and FLAIR hyperintensity seen previously is now accompanied by prominent abnormal vascular flow voids including development of what appears to be a large draining vein on series 10 image 23, communicating with the superior sagittal sinus. See also coronal series 15, images 15 and 16). Susceptibility weighted imaging also demonstrates a nidus-like area of hemosiderin corresponding to the area of nodular enhancement in June (series 13, image 82). Note that susceptibility weighted imaging here was negative in May. The extent of the gyriform FLAIR hyperintensity is stable along the superior convexity but appears mildly increased along the posterior inferior extent (series 11, image 19 and series 15, image 15). No contrast administered today. No post biopsy changes are evident. Outside of this area gray and white matter signal is stable and within normal limits. No other diffusion abnormality. No significant intracranial mass effect. No ventriculomegaly. No extra-axial collection or acute intracranial hemorrhage. Cervicomedullary junction and pituitary are within normal limits. Vascular: Major intracranial vascular flow voids are stable along the base of the brain. Skull and upper cervical spine: Negative visible cervical spine and bone marrow signal. Sinuses/Orbits: Stable and negative. Other: Mastoids remain clear. Visible internal auditory structures appear normal. Scalp and face soft tissues appear stable and negative. IMPRESSION: No post biopsy changes are evident overlying  the medial left superior frontal gyrus lesion, but significant new findings have developed since 11/27/2017 the including nodular hemosiderin and abnormal vessels associated with the gyriform area of signal abnormality, including what appears to be a large abnormal draining vein (series 15, image 15). The appearance now more resembles a high flow vascular malformation/AVM rather than a neoplasm. There is new vasogenic edema along the lateral aspect of the lesion, and a small area of cortical restricted diffusion along the anterior margin. Electronically Signed   By: Genevie Ann M.D.   On: 02/26/2018 15:42    Pathology:  Assessment/Plan 1. Brain mass  2. Focal seizures ([place])  Ms. Posada is clinically stable today.  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  Our  recommendation at this time is for referral to Dr. Kathyrn Sheriff for conventional angiography for assessment and characterization of AVM and to plan further treatment.  Once complete, our plan will be to repeat MRI brain with and w/o contrast to re-characterize right frontal T2 signal region.  It is possible that this is all underlying venous congestion and not representative of neoplasm.  Conversely, it is also possible that the nidus of the AVM was formed within bed of tumor.  Keppra should be continued at '750mg'$  q12.    We recommended and ordered amlodipine '5mg'$  daily for elevated blood pressure.  She will follow up with her PCP later this week for further management.  We did counsel her on the theoretical risk of hemorrhage at site of AVM if she does not control blood pressure.  We appreciate the opportunity to participate in the care of Brande Elderkin.   She should return to clinic after treatment for AVM and subsequent MRI.  All questions were answered. The patient knows to call the clinic with any problems, questions or concerns. No barriers to  learning were detected.  The total time spent in the encounter was 60 minutes and more than 50% was on counseling and review of test results   Ventura Sellers, MD Medical Director of Neuro-Oncology [facility] 03/01/18 12:41 PM

## 2018-03-02 ENCOUNTER — Telehealth: Payer: Self-pay

## 2018-03-02 NOTE — Telephone Encounter (Signed)
Per 9/30 no los

## 2018-03-03 NOTE — Progress Notes (Signed)
Brain and Spine Tumor Board Documentation  Diana [name] was presented by Cecil Cobbs, MD at Brain and Spine Tumor Board on 03/03/2018, which included representatives from neuro oncology, radiation oncology, surgical oncology, genetics, navigation, pathology, radiology.  Diana [name] was presented as a current patient with history of the following treatments:  .  Additionally, we reviewed previous medical and familial history, history of present illness, and recent lab results along with all available histopathologic and imaging studies. The tumor board considered available treatment options and made the following recommendations:  Additional screening  Rec conventional angiography for assessment of AVM  Tumor board is a meeting of clinicians from various specialty areas who evaluate and discuss patients for whom a multidisciplinary approach is being considered. Final determinations in the plan of care are those of the provider(s). The responsibility for follow up of recommendations given during tumor board is that of the provider.   Today's extended care, comprehensive team conference, Diana [name] was not present for the discussion and was not examined.

## 2018-03-11 ENCOUNTER — Telehealth: Payer: Self-pay | Admitting: *Deleted

## 2018-03-11 ENCOUNTER — Telehealth: Payer: Self-pay | Admitting: Radiation Therapy

## 2018-03-11 ENCOUNTER — Encounter: Payer: Self-pay | Admitting: Radiation Therapy

## 2018-03-11 NOTE — Telephone Encounter (Signed)
Dr. Cleotilde Neer secretary, Diana [name], spoke with Diana [name] letting her know that Dr. Kathyrn Sheriff is happy to see her for a visit to discuss an angiogram for work up of her AVM,  but she needs to make a payment towards her existing debt before they can schedule that visit.  Diana [name]'s reply was that she is not interested in making an appointment right now or doing anything for further workup of the AVM. She said that she has bills coming in from different doctors and she just does not have the money to start another diagnostic process.   This has been shared with Dr. Kathyrn Sheriff and Dr. Mickeal Skinner.    Mont Dutton R.T.(R)(T) Special Procedures Navigator

## 2018-03-11 NOTE — Telephone Encounter (Signed)
Attempted to get patient scheduled for Conventional angiogram.  Unfortunately Dr Cleotilde Neer office is unable to proceed with scheduling at this time.  The patient needs to call them and make resolve of outstanding debts prior to any further scheduling.  Attempted to call patient to advise, lm for returned call, no details left on voicemail.

## 2018-03-11 NOTE — Patient Instructions (Signed)
Dr. Cleotilde Neer secretary, Jacqlyn Larsen, spoke with Diana [name] letting her know that Dr. Kathyrn Sheriff is happy to see her for a visit to discuss an angiogram for work up of her AVM,  but she needs to make a payment towards her existing debt before they can schedule that visit.  Ms. Millward's reply was that she is not interested in making an appointment right now or doing anything for further workup of the AVM. She said that she has bills coming in from different doctors and she just does not have the money to start another diagnostic process.   This has been  shared with Dr. Kathyrn Sheriff and Dr. Mickeal Skinner.    Mont Dutton R.T.(R)(T) Special Procedures Navigator

## 2018-03-11 NOTE — Telephone Encounter (Signed)
Patient returned call.  She is aware of debt.  She states their office was calling her husband.  She has called them and left a message.  Pending them calling her back.

## 2018-03-16 ENCOUNTER — Other Ambulatory Visit: Payer: Self-pay | Admitting: Nurse Practitioner

## 2018-03-16 DIAGNOSIS — E118 Type 2 diabetes mellitus with unspecified complications: Secondary | ICD-10-CM

## 2018-03-16 MED FILL — AMLODIPINE BESYLATE 5 MG TA: 5 | 30 days supply | Qty: 30 | Fill #0

## 2018-03-16 MED FILL — $LANTUS SOLOSTAR 100 UNITS/: 100 | 28 days supply | Qty: 3 | Fill #2

## 2018-04-06 ENCOUNTER — Other Ambulatory Visit: Payer: Self-pay

## 2018-04-06 MED ORDER — GLUCOSE BLOOD VI STRP: ORAL_STRIP | 12 refills | Status: AC

## 2018-04-06 MED FILL — TRUE METRIX TEST STRIP: 30 days supply | Qty: 100 | Fill #0

## 2018-04-06 MED FILL — METOPROLOL SUCCINATE ER 25: 25 | 30 days supply | Qty: 15 | Fill #1

## 2018-04-07 MED FILL — $LANTUS SOLOSTAR 100 UNITS/: 100 | 28 days supply | Qty: 3 | Fill #0

## 2018-04-21 ENCOUNTER — Ambulatory Visit: Payer: Self-pay | Attending: Nurse Practitioner | Admitting: Nurse Practitioner

## 2018-04-21 ENCOUNTER — Encounter: Payer: Self-pay | Admitting: Nurse Practitioner

## 2018-04-21 ENCOUNTER — Ambulatory Visit: Payer: Self-pay | Admitting: Nurse Practitioner

## 2018-04-21 VITALS — BP 179/81 | Temp 99.3°F | Ht 63.0 in | Wt 130.0 lb

## 2018-04-21 DIAGNOSIS — Z9889 Other specified postprocedural states: Secondary | ICD-10-CM | POA: Insufficient documentation

## 2018-04-21 DIAGNOSIS — Z79899 Other long term (current) drug therapy: Secondary | ICD-10-CM | POA: Insufficient documentation

## 2018-04-21 DIAGNOSIS — I10 Essential (primary) hypertension: Secondary | ICD-10-CM | POA: Insufficient documentation

## 2018-04-21 DIAGNOSIS — I272 Pulmonary hypertension, unspecified: Secondary | ICD-10-CM | POA: Insufficient documentation

## 2018-04-21 DIAGNOSIS — E118 Type 2 diabetes mellitus with unspecified complications: Secondary | ICD-10-CM | POA: Insufficient documentation

## 2018-04-21 DIAGNOSIS — Z794 Long term (current) use of insulin: Secondary | ICD-10-CM | POA: Insufficient documentation

## 2018-04-21 DIAGNOSIS — Z833 Family history of diabetes mellitus: Secondary | ICD-10-CM | POA: Insufficient documentation

## 2018-04-21 DIAGNOSIS — E782 Mixed hyperlipidemia: Secondary | ICD-10-CM | POA: Insufficient documentation

## 2018-04-21 DIAGNOSIS — Z9049 Acquired absence of other specified parts of digestive tract: Secondary | ICD-10-CM | POA: Insufficient documentation

## 2018-04-21 LAB — POCT GLYCOSYLATED HEMOGLOBIN (HGB A1C): Hemoglobin A1C: 5.3 % (ref 4.0–5.6)

## 2018-04-21 LAB — GLUCOSE, POCT (MANUAL RESULT ENTRY): POC Glucose: 158 mg/dl — AB (ref 70–99)

## 2018-04-21 NOTE — Patient Instructions (Signed)
Mindfulness-Based Stress Reduction  Mindfulness-based stress reduction (MBSR) is a program that helps people learn to practice mindfulness. Mindfulness is the practice of intentionally paying attention to the present moment. It can be learned and practiced through techniques such as education, breathing exercises, meditation, and yoga. MBSR includes several mindfulness techniques in one program.  MBSR works best when you understand the treatment, are willing to try new things, and can commit to spending time practicing what you learn. MBSR training may include learning about:  · How your emotions, thoughts, and reactions affect your body.  · New ways to respond to things that cause negative thoughts to start (triggers).  · How to notice your thoughts and let go of them.  · Practicing awareness of everyday things that you normally do without thinking.  · The techniques and goals of different types of meditation.    What are the benefits of MBSR?  MBSR can have many benefits, which include helping you to:  · Develop self-awareness. This refers to knowing and understanding yourself.  · Learn skills and attitudes that help you to participate in your own health care.  · Learn new ways to care for yourself.  · Be more accepting about how things are, and let things go.  · Be less judgmental and approach things with an open mind.  · Be patient with yourself and trust yourself more.    MBSR has also been shown to:  · Reduce negative emotions, such as depression and anxiety.  · Improve memory and focus.  · Change how you sense and approach pain.  · Boost your body's ability to fight infections.  · Help you connect better with other people.  · Improve your sense of well-being.    Follow these instructions at home:  · Find a local in-person or online MBSR program.  · Set aside some time regularly for mindfulness practice.  · Find a mindfulness practice that works best for you. This may include one or more of the  following:  ? Meditation. Meditation involves focusing your mind on a certain thought or activity.  ? Breathing awareness exercises. These help you to stay present by focusing on your breath.  ? Body scan. For this practice, you lie down and pay attention to each part of your body from head to toe. You can identify tension and soreness and intentionally relax parts of your body.  ? Yoga. Yoga involves stretching and breathing, and it can improve your ability to move and be flexible. It can also provide an experience of testing your body's limits, which can help you release stress.  ? Mindful eating. This way of eating involves focusing on the taste, texture, color, and smell of each bite of food. Because this slows down eating and helps you feel full sooner, it can be an important part of a weight-loss plan.  · Find a podcast or recording that provides guidance for breathing awareness, body scan, or meditation exercises. You can listen to these any time when you have a free moment to rest without distractions.  · Follow your treatment plan as told by your health care provider. This may include taking regular medicines and making changes to your diet or lifestyle as recommended.  How to practice mindfulness  To do a basic awareness exercise:  · Find a comfortable place to sit.  · Pay attention to the present moment. Observe your thoughts, feelings, and surroundings just as they are.  · Avoid placing judgment on yourself,   your feelings, or your surroundings. Make note of any judgment that comes up, and let it go.  · Your mind may wander, and that is okay. Make note of when your thoughts drift, and return your attention to the present moment.    To do basic mindfulness meditation:  · Find a comfortable place to sit. This may include a stable chair or a firm floor cushion.  ? Sit upright with your back straight. Let your arms fall next to your side with your hands resting on your legs.  ? If sitting in a chair, rest  your feet flat on the floor.  ? If sitting on a cushion, cross your legs in front of you.  · Keep your head in a neutral position with your chin dropped slightly. Relax your jaw and rest the tip of your tongue on the roof of your mouth. Drop your gaze to the floor. You can close your eyes if you like.  · Breathe normally and pay attention to your breath. Feel the air moving in and out of your nose. Feel your belly expanding and relaxing with each breath.  · Your mind may wander, and that is okay. Make note of when your thoughts drift, and return your attention to your breath.  · Avoid placing judgment on yourself, your feelings, or your surroundings. Make note of any judgment or feelings that come up, let them go, and bring your attention back to your breath.  · When you are ready, lift your gaze or open your eyes. Pay attention to how your body feels after the meditation.    Where to find more information:  You can find more information about MBSR from:  · Your health care provider.  · Community-based meditation centers or programs.  · Programs offered near you.    Summary  · Mindfulness-based stress reduction (MBSR) is a program that teaches you how to intentionally pay attention to the present moment. It is used with other treatments to help you cope better with daily stress, emotions, and pain.  · MBSR focuses on developing self-awareness, which allows you to respond to life stress without judgment or negative emotions.  · MBSR programs may involve learning different mindfulness practices, such as breathing exercises, meditation, yoga, body scan, or mindful eating. Find a mindfulness practice that works best for you, and set aside time for it on a regular basis.  This information is not intended to replace advice given to you by your health care provider. Make sure you discuss any questions you have with your health care provider.  Document Released: 09/25/2016 Document Revised: 09/25/2016 Document Reviewed:  09/25/2016  [employer]vier Interactive Patient Education © 2018 [employer]

## 2018-04-21 NOTE — Progress Notes (Signed)
Assessment & Plan:  Diana [name] was seen today for follow-up.  Diagnoses and all orders for this visit:  Essential hypertension Continue all antihypertensives as prescribed.  Remember to bring in your blood pressure log with you for your follow up appointment.  DASH/Mediterranean Diets are healthier choices for HTN.    Type 2 diabetes mellitus with complication, with long-term current use of insulin (HCC) -     Glucose (CBG) -     HgB A1c Continue blood sugar control as discussed in office today, low carbohydrate diet, and regular physical exercise as tolerated, 150 minutes per week (30 min each day, 5 days per week, or 50 min 3 days per week). Keep blood sugar logs with fasting goal of 90-130 mg/dl, post prandial (after you eat) less than 180.  For Hypoglycemia: BS <60 and Hyperglycemia BS >400; contact the clinic ASAP. Annual eye exams and foot exams are recommended.  Mixed hyperlipidemia -     Lipid panel INSTRUCTIONS: Work on a low fat, heart healthy diet and participate in regular aerobic exercise program by working out at least 150 minutes per week; 5 days a week-30 minutes per day. Avoid red meat, fried foods. junk foods, sodas, sugary drinks, unhealthy snacking, alcohol and smoking.  Drink at least 48oz of water per day and monitor your carbohydrate intake daily.     Patient has been counseled on age-appropriate routine health concerns for screening and prevention. These are reviewed and up-to-date. Referrals have been placed accordingly. Immunizations are up-to-date or declined.    Subjective:   Chief Complaint  Patient presents with  . Follow-up    Pt. is here to follow-up. Pt. is fasting for lab work.    HPI Esmeralda Breighner 62 y.o. female presents to office today for follow up of HTN and DM.   Diabetes Mellitus Type 2  A1c greatly improved. Will stop Lantus 4 units and have patient return in a few weeks with her meter to ascertain the need to start any oral agents. She currently  denies any hypo or hyperglycemic symptoms. At this time will try to control DM with diet and exercise.  Lab Results  Component Value Date   HGBA1C 5.3 04/21/2018   Essential Hypertension She has not been taking the amlodipine 5mg  that was prescribed for her by Oncology a few months ago due to a consistently elevated blood pressure in the office. Today she states the amlodipine made her feet feel numb. She states she read on line that it was a side effect of amlodipine and does not want to take it anymore. She also states her atorvastatin cause the same sensation in her feet so she does not take it as prescribed. Her blood pressure is significantly elevated today however she has her home blood pressure monitor and her blood pressure log. Her blood pressure monitor does correlate with today's high reading which shows its accuracy. Her blood pressure log shows average readings 120-130/80s with HR average 80s. It appears she does has white coat syndrome with HTN. She denies chest pain, shortness of breath, palpitations, lightheadedness, dizziness, headaches or BLE edema.   Hyperlipidemia Patient presents for follow up to hyperlipidemia.  She is not medication compliant. She is diet compliant and affirms pins and needles sensation or statin intolerance. Lab Results  Component Value Date   CHOL 125 12/23/2017   Lab Results  Component Value Date   HDL 35 (L) 12/23/2017   Lab Results  Component Value Date   LDLCALC 69 12/23/2017  Lab Results  Component Value Date   TRIG 106 12/23/2017   Lab Results  Component Value Date   CHOLHDL 3.6 12/23/2017         Review of Systems  Constitutional: Negative for fever, malaise/fatigue and weight loss.  HENT: Negative.  Negative for nosebleeds.   Eyes: Negative.  Negative for blurred vision, double vision and photophobia.  Respiratory: Negative.  Negative for cough and shortness of breath.   Cardiovascular: Negative.  Negative for chest pain,  palpitations and leg swelling.  Gastrointestinal: Negative.  Negative for heartburn, nausea and vomiting.  Musculoskeletal: Negative.  Negative for myalgias.  Neurological: Positive for tingling and sensory change. Negative for dizziness, focal weakness, seizures and headaches.  Psychiatric/Behavioral: Negative for suicidal ideas. The patient is nervous/anxious.     Past Medical History:  Diagnosis Date  . ACS (acute coronary syndrome) ([place]) 10/16/2017  . Brain mass 10/16/2017  . Hypertension   . Non-ST elevation (NSTEMI) myocardial infarction (HCC)    Troponin peak 4.5-  10/16/17 Normal coronaries at cath  . Pulmonary hypertension ([place])    mild with PASP 18mmHg on echo 10/2017  . Seizure-like activity ([place]) 10/16/2017  . Takotsubo cardiomyopathy    EF normalized by echo 10/2017  . Type 2 diabetes mellitus with complication, with long-term current use of insulin ([place]) 10/16/2017    Past Surgical History:  Procedure Laterality Date  . CHOLECYSTECTOMY  1997  . LEFT HEART CATH AND CORONARY ANGIOGRAPHY N/A 10/19/2017   Procedure: LEFT HEART CATH AND CORONARY ANGIOGRAPHY;  Surgeon: Troy Sine, MD;  Location: [facility];  Service: Cardiovascular;  Laterality: N/A;    Family History  Problem Relation Age of Onset  . Diabetes Father     Social History Reviewed with no changes to be made today.   Outpatient Medications Prior to Visit  Medication Sig Dispense Refill  . atorvastatin (LIPITOR) 20 MG tablet Take 1 tablet (20 mg total) by mouth daily at 6 PM. 90 tablet 0  . glucose blood (TRUE METRIX BLOOD GLUCOSE TEST) test strip Use as instructed up to 3 times daily 100 each 12  . levETIRAcetam (KEPPRA) 750 MG tablet Take 1 tablet (750 mg total) by mouth 2 (two) times daily. 60 tablet 3  . amLODipine (NORVASC) 5 MG tablet Take 1 tablet (5 mg total) by mouth daily. 30 tablet 3  . LANTUS SOLOSTAR 100 UNIT/ML Solostar Pen INJECT 4 UNITS INTO THE SKIN DAILY AT 10 PM. 3 mL 2  .  metoprolol succinate (TOPROL-XL) 25 MG 24 hr tablet Take 0.5 tablets (12.5 mg total) by mouth daily. 90 tablet 1   No facility-administered medications prior to visit.     No Known Allergies     Objective:    BP (!) 179/81 (BP Location: Right Arm, Patient Position: Sitting, Cuff Size: Normal)   Temp 99.3 F (37.4 C) (Oral)   Ht 5\' 3"  (1.6 m)   Wt 130 lb (59 kg)   SpO2 100%   BMI 23.03 kg/m  Wt Readings from Last 3 Encounters:  04/21/18 130 lb (59 kg)  03/01/18 134 lb 3.2 oz (60.9 kg)  01/20/18 144 lb 3.2 oz (65.4 kg)    Physical Exam  Constitutional: She is oriented to person, place, and time. She appears well-developed and well-nourished. She is cooperative.  HENT:  Head: Normocephalic and atraumatic.  Eyes: EOM are normal.  Neck: Normal range of motion.  Cardiovascular: Regular rhythm, normal heart sounds and intact distal pulses. Tachycardia present.  Exam reveals no gallop and no friction rub.  No murmur heard. Pulmonary/Chest: Effort normal and breath sounds normal. No tachypnea. No respiratory distress. She has no decreased breath sounds. She has no wheezes. She has no rhonchi. She has no rales. She exhibits no tenderness.  Abdominal: Soft. Bowel sounds are normal.  Musculoskeletal: Normal range of motion. She exhibits no edema.  Neurological: She is alert and oriented to person, place, and time. Coordination normal.  Skin: Skin is warm and dry.  Psychiatric: She has a normal mood and affect. Her behavior is normal. Judgment and thought content normal.  Nursing note and vitals reviewed.      Patient has been counseled extensively about nutrition and exercise as well as the importance of adherence with medications and regular follow-up. The patient was given clear instructions to go to ER or return to medical center if symptoms don't improve, worsen or new problems develop. The patient verbalized understanding.   Follow-up: Return in about 3 weeks (around 05/12/2018)  for DM F/U; bring meter and log.   Gildardo Pounds, FNP-BC [facility], [place]   04/21/2018, 8:16 PM

## 2018-04-21 NOTE — Assessment & Plan Note (Addendum)
Continue blood sugar control as discussed in office today, low carbohydrate diet, and regular physical exercise as tolerated, 150 minutes per week (30 min each day, 5 days per week, or 50 min 3 days per week). Keep blood sugar logs with fasting goal of 90-130 mg/dl, post prandial (after you eat) less than 180.  For Hypoglycemia: BS <60 and Hyperglycemia BS >400; contact the clinic ASAP. Annual eye exams and foot exams are recommended.  

## 2018-04-22 LAB — LIPID PANEL
Chol/HDL Ratio: 3.4 ratio (ref 0.0–4.4)
Cholesterol, Total: 140 mg/dL (ref 100–199)
HDL: 41 mg/dL (ref >39)
LDL Calculated: 76 mg/dL (ref 0–99)
Triglycerides: 114 mg/dL (ref 0–149)
VLDL Cholesterol Cal: 23 mg/dL (ref 5–40)

## 2018-04-27 ENCOUNTER — Telehealth: Payer: Self-pay | Admitting: Nurse Practitioner

## 2018-04-27 NOTE — Telephone Encounter (Signed)
Patient called to check on her cholesterol and to ensure the Lipitor will be taken off of her medication list. She recalls being told it would be taken off if cholesterol improved.Please follow up.

## 2018-05-07 ENCOUNTER — Ambulatory Visit (INDEPENDENT_AMBULATORY_CARE_PROVIDER_SITE_OTHER): Payer: Self-pay

## 2018-05-07 ENCOUNTER — Ambulatory Visit: Payer: Self-pay | Attending: Family Medicine | Admitting: Pharmacist

## 2018-05-07 ENCOUNTER — Ambulatory Visit (INDEPENDENT_AMBULATORY_CARE_PROVIDER_SITE_OTHER): Payer: Self-pay | Admitting: Cardiology

## 2018-05-07 ENCOUNTER — Encounter: Payer: Self-pay | Admitting: Pharmacist

## 2018-05-07 ENCOUNTER — Ambulatory Visit: Payer: Self-pay | Admitting: Nurse Practitioner

## 2018-05-07 ENCOUNTER — Encounter: Payer: Self-pay | Admitting: Cardiology

## 2018-05-07 ENCOUNTER — Ambulatory Visit: Payer: Self-pay | Admitting: Pharmacist

## 2018-05-07 VITALS — BP 158/96 | HR 112 | Ht 63.0 in | Wt 129.8 lb

## 2018-05-07 DIAGNOSIS — I5181 Takotsubo syndrome: Secondary | ICD-10-CM

## 2018-05-07 DIAGNOSIS — Z794 Long term (current) use of insulin: Secondary | ICD-10-CM | POA: Insufficient documentation

## 2018-05-07 DIAGNOSIS — G9389 Other specified disorders of brain: Secondary | ICD-10-CM

## 2018-05-07 DIAGNOSIS — I10 Essential (primary) hypertension: Secondary | ICD-10-CM

## 2018-05-07 DIAGNOSIS — R030 Elevated blood-pressure reading, without diagnosis of hypertension: Secondary | ICD-10-CM

## 2018-05-07 DIAGNOSIS — E118 Type 2 diabetes mellitus with unspecified complications: Secondary | ICD-10-CM | POA: Insufficient documentation

## 2018-05-07 DIAGNOSIS — E119 Type 2 diabetes mellitus without complications: Secondary | ICD-10-CM

## 2018-05-07 LAB — GLUCOSE, POCT (MANUAL RESULT ENTRY): POC Glucose: 129 mg/dl — AB (ref 70–99)

## 2018-05-07 NOTE — Patient Instructions (Addendum)
Medication Instructions:  Your physician recommends that you continue on your current medications as directed. Please refer to the Current Medication list given to you today.  If you need a refill on your cardiac medications before your next appointment, please call your pharmacy.   Lab work: None Ordered  If you have labs (blood work) drawn today and your tests are completely normal, you will receive your results only by: Marland Kitchen MyChart Message (if you have MyChart) OR . A paper copy in the mail If you have any lab test that is abnormal or we need to change your treatment, we will call you to review the results.  Testing/Procedures: Your physician recommends that you wear a 24 hour Blood Pressure Monitor  Follow-Up: At [facility], you and your health needs are our priority.  As part of our continuing mission to provide you with exceptional heart care, we have created designated Provider Care Teams.  These Care Teams include your primary Cardiologist (physician) and Advanced Practice Providers (APPs -  Physician Assistants and Nurse Practitioners) who all work together to provide you with the care you need, when you need it. . You will need a follow up appointment in 1 year.  Please call our office 2 months in advance to schedule this appointment.  You may see Fransico Him, MD or one of the following Advanced Practice Providers on your designated Care Team:   . Lyda Jester, PA-C . Dayna Dunn, PA-C . Ermalinda Barrios, PA-C  Any Other Special Instructions Will Be Listed Below (If Applicable).

## 2018-05-07 NOTE — Progress Notes (Signed)
    S:    PCP: Zelda   No chief complaint on file.  Patient arrives in good spirits. Presents for diabetes management at the request of Zelda. Patient was referred on 04/21/18. Her Lantus was stopped/ Zelda agreed to let patient attempt to control DM with diet. A1c at that visit 5.3.   Family/Social History: no pertinent positives, never smoker, denies alcohol use  Insurance coverage/medication affordability: self-pay  Patient is diet controlled. She is not taking any medications.   Patient denies hypoglycemic events.  Patient reported dietary habits:  - follows "strict" diabetic diet  Patient-reported exercise habits:  - pt walks 5x/week for > 30 minutes   Patient denies nocturia.  Patient denies neuropathy. Patient denies visual changes. Patient reports self foot exams.   O:  POCT: 129 (1-hr post prandial) Home fasting CBG: 115 - 130 (1 outlier of 148 - morning after Thanksgiving)  2 hour post-prandial/random CBG: 107 - 167   Lab Results  Component Value Date   HGBA1C 5.3 04/21/2018   There were no vitals filed for this visit.  Lipid Panel     Component Value Date/Time   CHOL 140 04/21/2018 1157   TRIG 114 04/21/2018 1157   HDL 41 04/21/2018 1157   CHOLHDL 3.4 04/21/2018 1157   CHOLHDL 6.0 10/17/2017 0640   VLDL 39 10/17/2017 0640   LDLCALC 76 04/21/2018 1157    Clinical ASCVD: Yes  The ASCVD Risk score Mikey Bussing DC Jr., et al., 2013) failed to calculate for the following reasons:   The patient has a prior MI or stroke diagnosis    A/P: Diabetes longstanding currently diet-controlled. A1c and home glucose levels are at goal.  -Will continue with diet control. Will not start medications at this time. -Extensively discussed pathophysiology of DM, recommended lifestyle interventions, dietary effects on glycemic control -Counseled on s/sx of and management of hypoglycemia -Next A1C anticipated 10/2018.   ASCVD risk - secondary prevention in patient with DM and  hx of NSTEMI. Last LDL is not at goal of <70. Pt should be on high intensity statin therapy with her ASCVD history. She is refusing to take. When I attempted to discuss the benefits of statin therapy, pt refused stating, "I've read too much about cholesterol medications".   Written patient instructions provided. Total time in face to face counseling 15 minutes.   Follow up w/ PCP 08/06/2018.   Benard Halsted, PharmD, [place] [phone]

## 2018-05-07 NOTE — Progress Notes (Signed)
Cardiology Office Note:    Date:  05/07/2018   ID:  Diana [name], DOB [birth year], MRN [id number]  PCP:  Gildardo Pounds, NP  Cardiologist:  Fransico Him, MD    Referring MD: Gildardo Pounds, NP   Chief Complaint  Patient presents with  . Cardiomyopathy    History of Present Illness:    Diana [name] is a 62 y.o. 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  Noted to have an elevated trop at that time and Echo showed her EF to be 25% with anterior apical AK. Cath 10/19/17 showed normal coronariesand severe LV dysfunction consistent with Takotsubo cardiomyopathy but treatment of DCM difficult due to soft BP.  She was evaluated by oncology (Dr Mickeal Skinner) and neurosurgery (Dr Saintclair Halsted).  She ultimately was started on Entresto low-dose which was later stopped due to cost.  She is here today for followup and is doing well.  She denies any chest pain or pressure, SOB, DOE, PND, orthopnea, LE edema, dizziness, palpitations or syncope. She is compliant with her meds and is tolerating meds with no SE.    Past Medical History:  Diagnosis Date  . ACS (acute coronary syndrome) ([place]) 10/16/2017  . Brain mass 10/16/2017  . Hypertension   . Non-ST elevation (NSTEMI) myocardial infarction (HCC)    Troponin peak 4.5-  10/16/17 Normal coronaries at cath  . Pulmonary hypertension ([place])    mild with PASP 43mmHg on echo 10/2017  . Seizure-like activity ([place]) 10/16/2017  . Takotsubo cardiomyopathy    EF normalized by echo 10/2017  . Type 2 diabetes mellitus with complication, with long-term current use of insulin ([place]) 10/16/2017    Past Surgical History:  Procedure Laterality Date  . CHOLECYSTECTOMY  1997  . LEFT HEART CATH AND CORONARY ANGIOGRAPHY N/A 10/19/2017   Procedure: LEFT HEART CATH AND CORONARY ANGIOGRAPHY;  Surgeon: Troy Sine, MD;  Location: [facility];  Service: Cardiovascular;  Laterality: N/A;    Current Medications: Current Meds  Medication Sig  . atorvastatin (LIPITOR) 20  MG tablet Take 1 tablet (20 mg total) by mouth daily at 6 PM.  . glucose blood (TRUE METRIX BLOOD GLUCOSE TEST) test strip Use as instructed up to 3 times daily  . levETIRAcetam (KEPPRA) 750 MG tablet Take 1 tablet (750 mg total) by mouth 2 (two) times daily.     Allergies:   Patient has no known allergies.   Social History   Socioeconomic History  . Marital status: Married    Spouse name: Not on file  . Number of children: Not on file  . Years of education: Not on file  . Highest education level: Not on file  Occupational History  . Not on file  Social Needs  . Financial resource strain: Not on file  . Food insecurity:    Worry: Not on file    Inability: Not on file  . Transportation needs:    Medical: Not on file    Non-medical: Not on file  Tobacco Use  . Smoking status: Never Smoker  . Smokeless tobacco: Never Used  Substance and Sexual Activity  . Alcohol use: Never    Frequency: Never  . Drug use: Never  . Sexual activity: Yes  Lifestyle  . Physical activity:    Days per week: Not on file    Minutes per session: Not on file  . Stress: Not on file  Relationships  . Social connections:    Talks on  phone: Not on file    Gets together: Not on file    Attends religious service: Not on file    Active member of club or organization: Not on file    Attends meetings of clubs or organizations: Not on file    Relationship status: Not on file  Other Topics Concern  . Not on file  Social History Narrative  . Not on file     Family History: The patient's family history includes Diabetes in her father.  ROS:   Please see the history of present illness.    ROS  All other systems reviewed and negative.   EKGs/Labs/Other Studies Reviewed:    The following studies were reviewed today: Head MRI  EKG:  EKG is not ordered today.    Recent Labs: 10/17/2017: TSH 5.100 10/21/2017: Hemoglobin 12.9; Platelets 311 12/09/2017: ALT 28; BUN 13; Creatinine, Ser 0.72;  Potassium 4.4; Sodium 142   Recent Lipid Panel    Component Value Date/Time   CHOL 140 04/21/2018 1157   TRIG 114 04/21/2018 1157   HDL 41 04/21/2018 1157   CHOLHDL 3.4 04/21/2018 1157   CHOLHDL 6.0 10/17/2017 0640   VLDL 39 10/17/2017 0640   LDLCALC 76 04/21/2018 1157    Physical Exam:    VS:  There were no vitals taken for this visit.    Wt Readings from Last 3 Encounters:  04/21/18 130 lb (59 kg)  03/01/18 134 lb 3.2 oz (60.9 kg)  01/20/18 144 lb 3.2 oz (65.4 kg)     GEN:  Well nourished, well developed in no acute distress HEENT: Normal NECK: No JVD; No carotid bruits LYMPHATICS: No lymphadenopathy CARDIAC: RRR, no murmurs, rubs, gallops RESPIRATORY:  Clear to auscultation without rales, wheezing or rhonchi  ABDOMEN: Soft, non-tender, non-distended MUSCULOSKELETAL:  No edema; No deformity  SKIN: Warm and dry NEUROLOGIC:  Alert and oriented x 3 PSYCHIATRIC:  Normal affect   ASSESSMENT:    1. Takotsubo cardiomyopathy   2. Brain mass    PLAN:    In order of problems listed above:  1.  Takotsubo DCM - secondary to stress MI in setting of seizure.  Cath showed normal coronary arteries.  She was on Entresto but stopped it due to cost.  Repeat echo 10/2017 showed resolution of DCM with EF 55-60% and G2DD.  She is doing well with no CP or SOB.  2.  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    Medication Adjustments/Labs and Tests Ordered: Current medicines are reviewed at length with the patient today.  Concerns regarding medicines are outlined above.  No orders of the defined types were placed in this encounter.  No orders of the defined types were placed in this encounter.   Signed, Fransico Him, MD  05/07/2018 10:47 AM    [place]

## 2018-05-07 NOTE — Patient Instructions (Signed)
Thank you for coming to see me today. Please do the following:  1. We are not adding anything for diabetes. Continue to control with your diet.  2. Continue taking your cholesterol medication.  3. Continue checking blood sugars at home.  4. Continue making the lifestyle changes we've discussed together during our visit. Diet and exercise play a significant role in improving your blood sugars.  5. Follow-up with Diana [name] in March.

## 2018-05-10 ENCOUNTER — Other Ambulatory Visit: Payer: Self-pay | Admitting: Internal Medicine

## 2018-05-10 MED ORDER — LEVETIRACETAM 750 MG PO TABS: 750.0000 mg | ORAL_TABLET | Freq: Two times a day (BID) | ORAL | 3 refills | Status: AC

## 2018-05-10 MED FILL — TRUEplus LANCETS 28G MISC: 25 days supply | Qty: 100 | Fill #3

## 2018-05-10 MED FILL — TRUE METRIX TEST STRIP: 30 days supply | Qty: 100 | Fill #1

## 2018-05-10 MED FILL — METOPROLOL SUCCINATE ER 25: 25 | 30 days supply | Qty: 15 | Fill #2

## 2018-05-14 NOTE — Telephone Encounter (Signed)
Please release results to advise patient.

## 2018-05-17 ENCOUNTER — Telehealth: Payer: Self-pay | Admitting: Cardiology

## 2018-05-17 ENCOUNTER — Other Ambulatory Visit: Payer: Self-pay | Admitting: Nurse Practitioner

## 2018-05-17 NOTE — Progress Notes (Signed)
Please let patient know with her cardiac history it is recommended she take a statin for preventative measures lifelong. I have discontinued it per her request but she should be taking a statin.

## 2018-05-17 NOTE — Telephone Encounter (Signed)
Viewed by Coletta Memos on 04/29/2018 3:24 PM  Written by Gildardo Pounds, NP on 04/27/2018 8:46 AM  Cholesterol levels are all normal at this time. INSTRUCTIONS: Work on a low fat, heart healthy diet and participate in regular aerobic exercise program by working out at least 150 minutes per week; 5 days a week-30 minutes per day. Avoid red meat, fried foods. junk foods, sodas, sugary drinks, unhealthy snacking, alcohol and smoking. Drink at least 48oz of water per day and monitor your carbohydrate intake daily.     Patient viewed my notes on 04-29-2018. Her lipitor has been dc'd.

## 2018-05-17 NOTE — Telephone Encounter (Addendum)
Pt called to request Holter monitor results.. I advised her that we are waiting for the physician to review and will call her as soon as we receive them.. will check into the report and forward to MD covering Dr.Turner since Dr. Radford Pax is out of the office.

## 2018-05-17 NOTE — Telephone Encounter (Signed)
° ° °  Patient calling for results of BP monitoring

## 2018-05-18 NOTE — Telephone Encounter (Signed)
Spoke with the patient, we are waiting on the results and will call her once we get them.

## 2018-05-18 NOTE — Telephone Encounter (Signed)
  Patient is calling again requesting results. Patient would like a call back since its been over a week.

## 2018-05-19 NOTE — Progress Notes (Signed)
Pt aware of message from her PCP regarding statin medication. Pt states she does not want to take this medication. Encouraged to f/u with decision with her Cardiologist.

## 2018-06-03 NOTE — Telephone Encounter (Signed)
Spoke with the patient, when she wore her 24 hr blood pressure monitor, she was busy traveling from office to office going to appointments. She was traveling for 6 hrs and did not relax. She stated this is an abnormal day and her blood pressures are usually lower. Advised her to take her blood pressure for the next 3 days and I will call to collect the results on 1/6.

## 2018-06-03 NOTE — Telephone Encounter (Signed)
Follow up     Patient is following up to see if her monitor results is back. Please call to discuss.

## 2018-06-04 MED FILL — levETIRAcetam 750 MG TABS: 750 | 30 days supply | Qty: 60 | Fill #0

## 2018-06-04 MED FILL — METOPROLOL SUCCINATE ER 25: 25 | 30 days supply | Qty: 15 | Fill #3

## 2018-06-08 NOTE — Telephone Encounter (Signed)
Dr. Radford Pax reviewed her 24 blood pressure monitor and stated that she had high blood pressure and she should increase Toprol to 25 mg a day. I discussed that the patient was running around the whole day. She took her blood pressures over the weekend and reported them.  Friday: 136/82 Saturday: 124/85 Sunday: 134/84 Monday: 131/91   All readings were done after medication in the morning. Spoke with Dr. Radford Pax she stated the patient has normal blood pressure readings and did not need an increase in Toprol. Spoke with the patient and she expressed understanding and had no further questions.

## 2018-06-30 MED FILL — METOPROLOL SUCCINATE ER 25: 25 | 30 days supply | Qty: 15 | Fill #4

## 2018-06-30 MED FILL — LEVETIRACETAM 750 MG TABS: 750 | 30 days supply | Qty: 60 | Fill #1

## 2018-08-06 ENCOUNTER — Ambulatory Visit: Payer: Self-pay | Admitting: Nurse Practitioner

## 2018-11-01 DEATH — deceased

## 2019-06-23 IMAGING — MR MR HEAD WO/W CM
13 of 15 series · 38 of 48 positions shown · IV contrast (multihance)
Comparison: Prior CT from 10/16/2017.

CLINICAL DATA: Follow-up examination for intracranial mass.

EXAM:
MRI HEAD WITHOUT AND WITH CONTRAST
TECHNIQUE: Multiplanar, multiecho pulse sequences of the brain and surrounding
structures were obtained without and with intravenous contrast.
CONTRAST:  7mL MULTIHANCE GADOBENATE DIMEGLUMINE 529 MG/ML IV SOLN

[Series 5001: ax dwi_tracew · axial · 3.0mm · 1.50mm/px · z∈[-34,+99]mm · 5 of 80 slices shown]
[im 1/80]
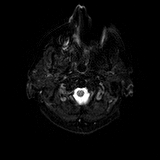
[im 20/80]
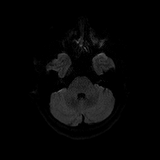
[im 40/80]
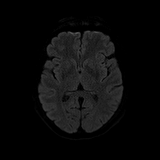
[im 60/80]
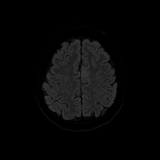
[im 80/80]
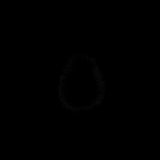

[Series 6001: ax dwi_adc · axial · 3.0mm · 1.50mm/px · z∈[-34,+99]mm · 3 of 39 slices shown]
[im 1/39]
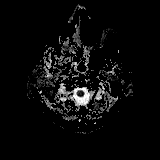
[im 20/39]
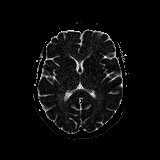
[im 39/39]
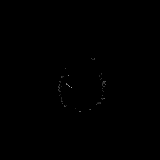

[Series 7001: cor dwi_tracew · coronal · 5.0mm · 1.44mm/px · 5 of 64 slices shown]
[im 1/64]
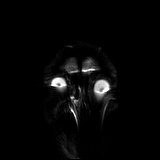
[im 16/64]
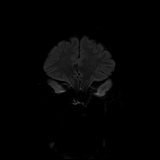
[im 32/64]
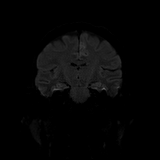
[im 48/64]
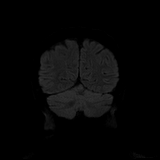
[im 64/64]
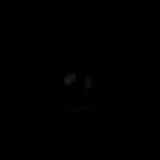

[Series 8001: cor dwi_adc · coronal · 5.0mm · 1.44mm/px · 2 of 31 slices shown]
[im 1/31]
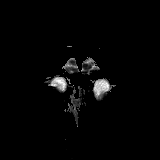
[im 31/31]
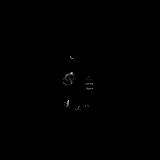

[Series 9001: T1 · sagittal · 5.0mm · 0.75mm/px · 2 of 23 slices shown]
[im 1/23]
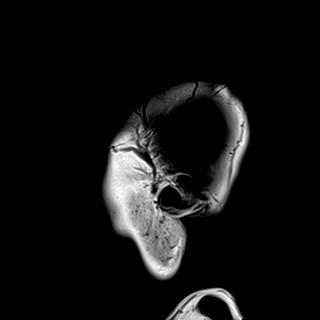
[im 23/23]
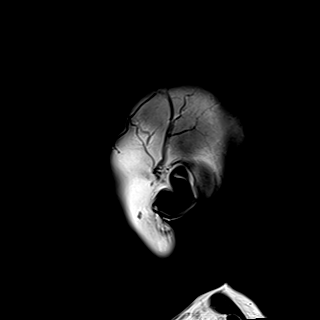

[T2 · axial · 5.0mm · 0.69mm/px · z∈[-34,+103]mm · 2 of 24 slices shown (1 of 2)]
[im 1/24]
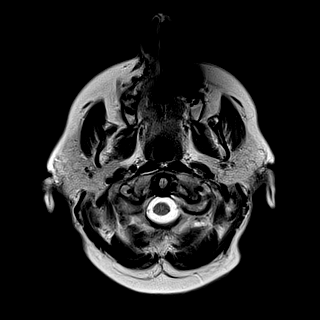
[im 24/24]
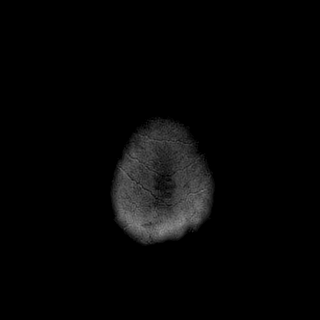

[FLAIR · axial · 5.0mm · 0.43mm/px · z∈[-34,+103]mm · 2 of 24 slices shown]
[im 1/24]
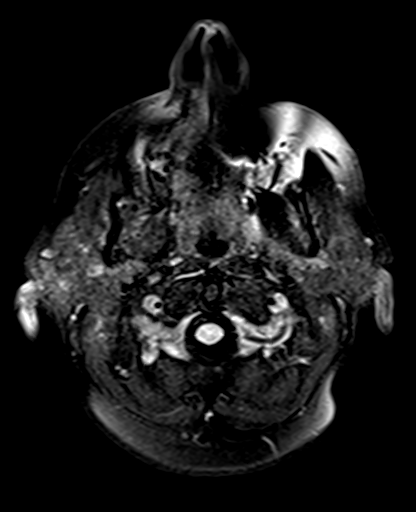
[im 24/24]
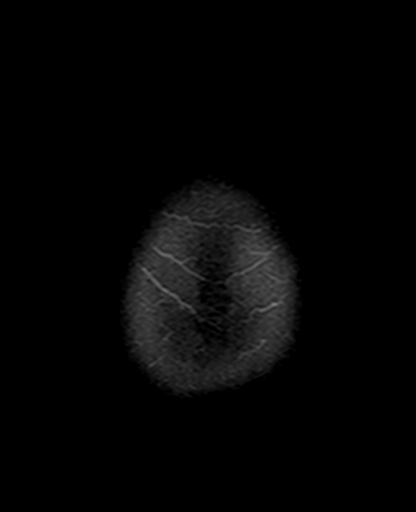

[swi_images · axial · 3.0mm · 0.86mm/px · z∈[-54,+120]mm · 5 of 60 slices shown]
[im 1/60]
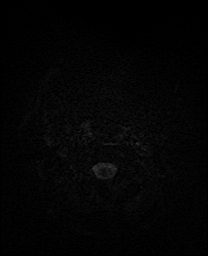
[im 15/60]
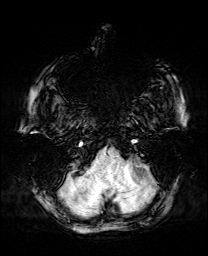
[im 30/60]
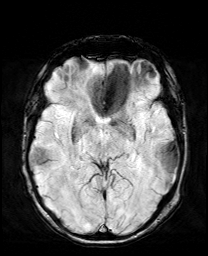
[im 45/60]
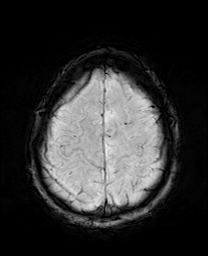
[im 60/60]
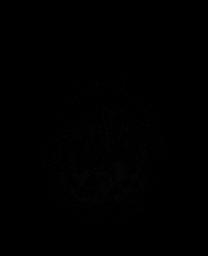

[mip_images(sw) · axial · 24.0mm · 0.86mm/px · z∈[-44,+110]mm · 4 of 53 slices shown]
[im 1/53]
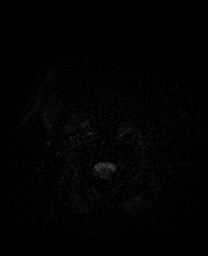
[im 18/53]
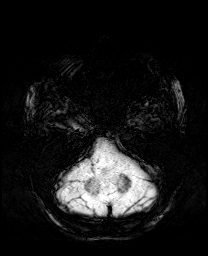
[im 35/53]
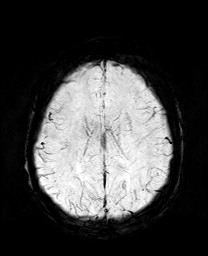
[im 53/53]
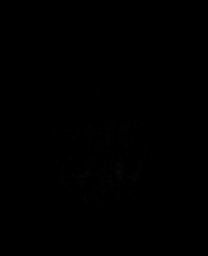

[T2 · coronal · 3.0mm · 0.27mm/px · 2 of 32 slices shown (2 of 2)]
[im 1/32]
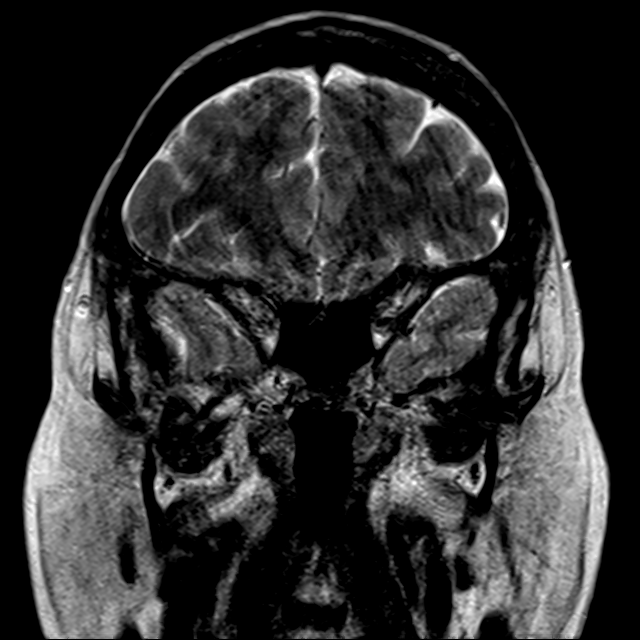
[im 32/32]
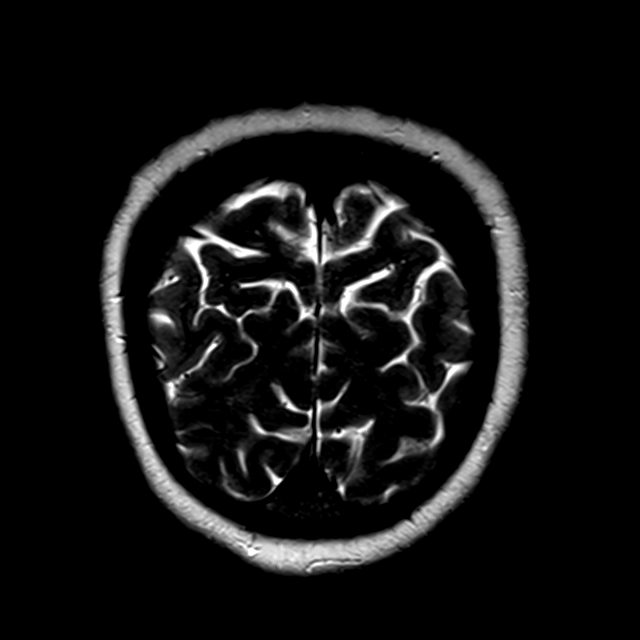

[T2 post-contrast · coronal · 5.0mm · 0.72mm/px · 2 of 28 slices shown]
[im 1/28]
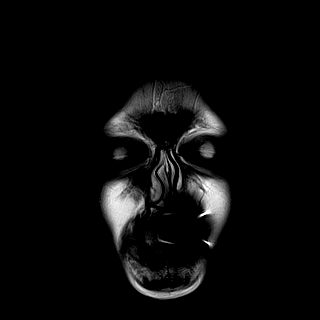
[im 28/28]
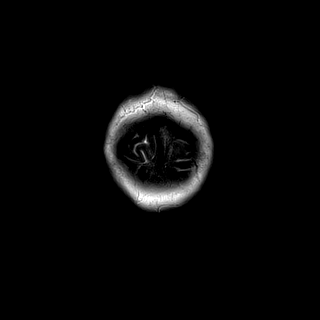

[T1 post-contrast · coronal · 5.0mm · 0.34mm/px · 2 of 28 slices shown (1 of 2)]
[im 1/28]
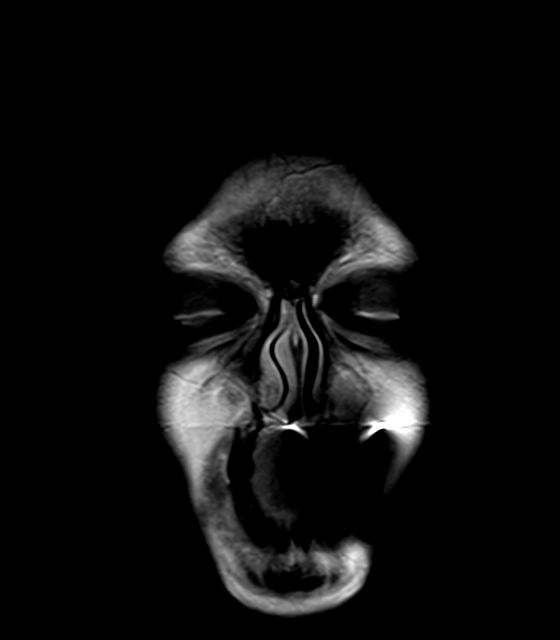
[im 28/28]
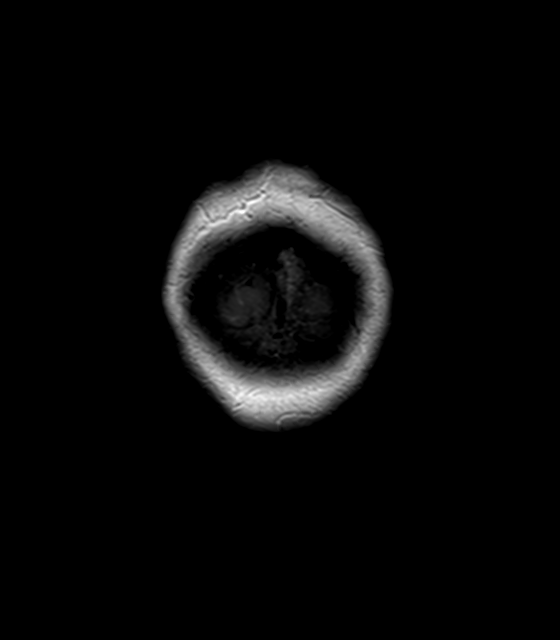

[T1 post-contrast · sagittal · 5.0mm · 0.75mm/px · 2 of 23 slices shown (2 of 2)]
[im 1/23]
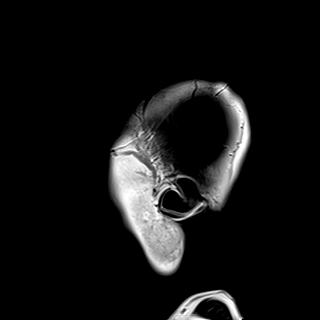
[im 23/23]
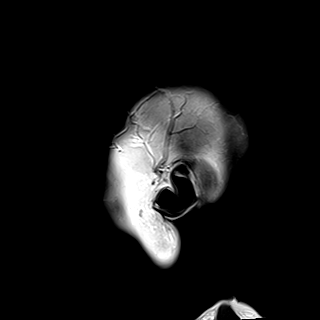

[38 of 48 positions shown; findings below may reference images not displayed]

FINDINGS: Brain: Abnormal T2/FLAIR signal abnormality seen involving the
parasagittal left frontal lobe, cingulate gyrus (series 99669, image
18). Area of involvement measures approximately 4.6 x 1.3 cm in
size. There is a superimposed area of abnormal postcontrast
enhancement within this region, measuring 1.5 x 1.2 x 1.3 cm (series
22992, image 45). This area postcontrast enhancement corresponds
with abnormality seen on prior CT. Signal changes are intra-axial in
location. No associated susceptibility artifact or intrinsic T1
shortening to suggest hemorrhage. Findings are most concerning for a
primary CNS neoplasm. Superimposed postictal changes may be
contributory as well. While subacute ischemia could conceivably have
this appearance, this is less favored given the enhancement pattern
in appearance on prior CT.

No other focal parenchymal signal abnormality identified. Cerebral
volume is normal for age. No significant cerebral white matter
changes. No other evidence for acute or subacute ischemia.
Gray-white matter differentiation otherwise well maintained. No
encephalomalacia to suggest chronic infarction. No foci of
susceptibility artifact to suggest acute or chronic intracranial
hemorrhage.

No other mass lesion, midline shift, or mass effect. No
hydrocephalus. No extra-axial fluid collection. Major dural sinuses
are grossly patent.

Pituitary gland and suprasellar region normal. Midline structures
intact and normal.

Vascular: Major intracranial vascular flow voids are maintained.

Skull and upper cervical spine: Craniocervical junction within
normal limits. Visualized upper cervical spine normal. Bone marrow
signal intensity within normal limits. No scalp soft tissue
abnormality.

Sinuses/Orbits: Globes orbital soft tissues within normal limits.
Paranasal sinuses are largely clear. No mastoid effusion. Inner ear
structures normal.

Other: None.
IMPRESSION: 1. Abnormal signal intensity with superimposed 1.5 x 1.2 x 1.3 cm
area of abnormal enhancement involving the left cingulate
parasagittal left frontal lobe as above. Findings are most
concerning for a primary CNS neoplasm. Superimposed postictal
changes may be contributory as well. While subacute ischemia could
conceivably have this appearance, this is less favored given the
enhancement pattern in appearance on prior CT. No associated
hemorrhage or mass effect.
2. Otherwise normal brain MRI.
